# Patient Record
Sex: Male | Born: 2000 | Race: White | Hispanic: No | Marital: Single | Smoking: Never smoker
Health system: Southern US, Community
[De-identification: ages and names within clinical notes are randomized; demographics above are authoritative.]

## PROBLEM LIST (undated history)

## (undated) ENCOUNTER — Ambulatory Visit: Admission: EM | Payer: Self-pay

## (undated) DIAGNOSIS — G809 Cerebral palsy, unspecified: Secondary | ICD-10-CM

## (undated) HISTORY — PX: ABDOMINAL SURGERY: SHX537

## (undated) HISTORY — PX: HERNIA REPAIR: SHX51

---
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
## 2018-10-05 ENCOUNTER — Encounter (HOSPITAL_BASED_OUTPATIENT_CLINIC_OR_DEPARTMENT_OTHER): Payer: Self-pay

## 2018-10-05 ENCOUNTER — Emergency Department (HOSPITAL_BASED_OUTPATIENT_CLINIC_OR_DEPARTMENT_OTHER): Payer: No Typology Code available for payment source

## 2018-10-05 ENCOUNTER — Other Ambulatory Visit: Payer: Self-pay

## 2018-10-05 ENCOUNTER — Inpatient Hospital Stay (HOSPITAL_BASED_OUTPATIENT_CLINIC_OR_DEPARTMENT_OTHER)
Admission: EM | Admit: 2018-10-05 | Discharge: 2018-10-08 | DRG: 481 | Disposition: A | Discharge status: Home / Self Care | Payer: No Typology Code available for payment source | Attending: Student | Admitting: Student

## 2018-10-05 DIAGNOSIS — Z419 Encounter for procedure for purposes other than remedying health state, unspecified: Secondary | ICD-10-CM

## 2018-10-05 DIAGNOSIS — T148XXA Other injury of unspecified body region, initial encounter: Secondary | ICD-10-CM

## 2018-10-05 DIAGNOSIS — G801 Spastic diplegic cerebral palsy: Secondary | ICD-10-CM | POA: Diagnosis present

## 2018-10-05 DIAGNOSIS — W010XXA Fall on same level from slipping, tripping and stumbling without subsequent striking against object, initial encounter: Secondary | ICD-10-CM | POA: Diagnosis present

## 2018-10-05 DIAGNOSIS — Y990 Civilian activity done for income or pay: Secondary | ICD-10-CM

## 2018-10-05 DIAGNOSIS — S72142A Displaced intertrochanteric fracture of left femur, initial encounter for closed fracture: Secondary | ICD-10-CM | POA: Diagnosis present

## 2018-10-05 DIAGNOSIS — M25552 Pain in left hip: Secondary | ICD-10-CM | POA: Diagnosis present

## 2018-10-05 DIAGNOSIS — S72145A Nondisplaced intertrochanteric fracture of left femur, initial encounter for closed fracture: Principal | ICD-10-CM | POA: Diagnosis present

## 2018-10-05 DIAGNOSIS — Y9289 Other specified places as the place of occurrence of the external cause: Secondary | ICD-10-CM | POA: Diagnosis not present

## 2018-10-05 HISTORY — DX: Cerebral palsy, unspecified: G80.9

## 2018-10-05 LAB — CBC WITH DIFFERENTIAL/PLATELET
Abs Immature Granulocytes: 0.1 10*3/uL — ABNORMAL HIGH (ref 0.00–0.07)
BASOS ABS: 0.1 10*3/uL (ref 0.0–0.1)
Basophils Relative: 0 %
EOS ABS: 0.1 10*3/uL (ref 0.0–0.5)
EOS PCT: 0 %
HCT: 48.9 % (ref 39.0–52.0)
Hemoglobin: 16.6 g/dL (ref 13.0–17.0)
Immature Granulocytes: 1 %
Lymphocytes Relative: 9 %
Lymphs Abs: 1.7 10*3/uL (ref 0.7–4.0)
MCH: 30.2 pg (ref 26.0–34.0)
MCHC: 33.9 g/dL (ref 30.0–36.0)
MCV: 89.1 fL (ref 80.0–100.0)
Monocytes Absolute: 1.1 10*3/uL — ABNORMAL HIGH (ref 0.1–1.0)
Monocytes Relative: 6 %
NRBC: 0 % (ref 0.0–0.2)
Neutro Abs: 16.5 10*3/uL — ABNORMAL HIGH (ref 1.7–7.7)
Neutrophils Relative %: 84 %
Platelets: 219 10*3/uL (ref 150–400)
RBC: 5.49 MIL/uL (ref 4.22–5.81)
RDW: 11.7 % (ref 11.5–15.5)
WBC: 19.5 10*3/uL — AB (ref 4.0–10.5)

## 2018-10-05 LAB — BASIC METABOLIC PANEL
Anion gap: 7 (ref 5–15)
BUN: 16 mg/dL (ref 6–20)
CALCIUM: 9.5 mg/dL (ref 8.9–10.3)
CO2: 26 mmol/L (ref 22–32)
CREATININE: 0.86 mg/dL (ref 0.61–1.24)
Chloride: 105 mmol/L (ref 98–111)
Glucose, Bld: 118 mg/dL — ABNORMAL HIGH (ref 70–99)
Potassium: 3.2 mmol/L — ABNORMAL LOW (ref 3.5–5.1)
SODIUM: 138 mmol/L (ref 135–145)

## 2018-10-05 MED ORDER — MORPHINE SULFATE (PF) 4 MG/ML IV SOLN
4.0000 mg | Freq: Once | INTRAVENOUS | Status: DC
  Filled 2018-10-05: qty 1

## 2018-10-05 MED ORDER — ONDANSETRON HCL 4 MG/2ML IJ SOLN: 4.0000 mg | Freq: Three times a day (TID) | INTRAMUSCULAR | Status: DC | PRN

## 2018-10-05 MED ORDER — SODIUM CHLORIDE 0.45 % IV SOLN
INTRAVENOUS | Status: AC
  Administered 2018-10-06: 02:00:00 via INTRAVENOUS

## 2018-10-05 MED ORDER — MORPHINE SULFATE (PF) 4 MG/ML IV SOLN
INTRAVENOUS | Status: AC
  Filled 2018-10-05: qty 1

## 2018-10-05 MED ORDER — MORPHINE SULFATE (PF) 4 MG/ML IV SOLN
4.0000 mg | Freq: Once | INTRAVENOUS | Status: AC
  Administered 2018-10-05: 4 mg via INTRAVENOUS

## 2018-10-05 MED ORDER — ONDANSETRON HCL 4 MG/2ML IJ SOLN
4.0000 mg | Freq: Once | INTRAMUSCULAR | Status: AC
  Administered 2018-10-05: 4 mg via INTRAVENOUS
  Filled 2018-10-05: qty 2

## 2018-10-05 MED ORDER — FENTANYL CITRATE (PF) 100 MCG/2ML IJ SOLN
50.0000 ug | Freq: Once | INTRAMUSCULAR | Status: AC
  Administered 2018-10-05: 50 ug via NASAL
  Filled 2018-10-05: qty 2

## 2018-10-05 MED ORDER — ONDANSETRON HCL 4 MG/2ML IJ SOLN
4.0000 mg | Freq: Once | INTRAMUSCULAR | Status: DC
  Filled 2018-10-05: qty 2

## 2018-10-05 MED ORDER — HYDROMORPHONE HCL 1 MG/ML IJ SOLN
0.5000 mg | INTRAMUSCULAR | Status: DC | PRN
  Administered 2018-10-05: 0.5 mg via INTRAVENOUS
  Filled 2018-10-05: qty 1

## 2018-10-05 NOTE — ED Notes (Signed)
[facility] notified (Taryn) - patient ready for transport 

## 2018-10-05 NOTE — ED Notes (Signed)
Patient transported to X-ray 

## 2018-10-05 NOTE — ED Notes (Signed)
Pt screaming in pain- declined morphine at this time. Intranasal fentanyl ordered at this time- see MAR. Comfort measures provided. Pt to Xray shortly.

## 2018-10-05 NOTE — ED Triage Notes (Signed)
Pt arrives [facility] after a fall at work - fell on left hip. No LOC. Redness noted but no obvious deformity. 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 Mother at bedside. Abrasion to left elbow- denies pain. Pt a/o x 4

## 2018-10-05 NOTE — ED Provider Notes (Signed)
[facility] EMERGENCY DEPARTMENT Provider Note   CSN: [id number] Arrival date & time: 10/05/18  2015    History   Chief Complaint Chief Complaint  Patient presents with  . Fall    HPI Jason Watts is a 18 y.o. male.     The history is provided by the patient and a parent. No language interpreter was used.  Fall    Jason Watts is a 18 y.o. male who presents to the Emergency Department complaining of hip pain. He presents to the emergency department by EMS accompanied by his mother for evaluation of injuries following a fall. He was at work and slipped on a wet floor and fell in a cartoonlike fashion per patient to the floor, landing on his left hip. He reports severe pain to his left hip and thigh that radiates at times to his knee. His position of comfort is with the lower extremity flexed. He does have a history of cerebral palsy with spasticity in his left lower extremity. He is not on any current medications. No history of fracture. He denies any head injury, abdominal pain, chest pain. Past Medical History:  Diagnosis Date  . Cerebral palsy (HCC)    left side    Patient Active Problem List   Diagnosis Date Noted  . Closed fracture of femur, intertrochanteric, left, initial encounter (HCC) 10/05/2018  . Intertrochanteric fracture, closed, left, initial encounter (HCC) 10/05/2018    Past Surgical History:  Procedure Laterality Date  . ABDOMINAL SURGERY    . HERNIA REPAIR          Home Medications    Prior to Admission medications   Not on File    Family History No family history on file.  Social History Social History   Tobacco Use  . Smoking status: Never Smoker  . Smokeless tobacco: Never Used  Substance Use Topics  . Alcohol use: No  . Drug use: Never     Allergies   Patient has no known allergies.   Review of Systems Review of Systems  All other systems reviewed and are negative.    Physical Exam Updated Vital  Signs BP 120/70 (BP Location: Right Arm)   Pulse 88   Temp 98.3 F (36.8 C) (Oral)   Resp 16   Ht 6' (1.829 m)   Wt 61.2 kg   SpO2 100%   BMI 18.31 kg/m   Physical Exam Vitals signs and nursing note reviewed.  Constitutional:      Appearance: He is well-developed.  HENT:     Head: Normocephalic and atraumatic.  Cardiovascular:     Rate and Rhythm: Normal rate and regular rhythm.     Heart sounds: No murmur.  Pulmonary:     Effort: Pulmonary effort is normal. No respiratory distress.     Breath sounds: Normal breath sounds.  Abdominal:     Palpations: Abdomen is soft.     Tenderness: There is no abdominal tenderness. There is no guarding or rebound.  Musculoskeletal:        General: No tenderness.     Comments: 2+ DP pulses. There is tenderness to palpation about the left lateral hip and proximal thigh. Position of comfort is with the knee flexed and the hip flex. Unable to fully extend the left lower extremity. No significant tenderness to the knee or ankle. No significant edema to the left lower extremity.  Skin:    General: Skin is warm and dry.  Neurological:  Mental Status: He is alert and oriented to person, place, and time.  Psychiatric:        Behavior: Behavior normal.      ED Treatments / Results  Labs (all labs ordered are listed, but only abnormal results are displayed) Labs Reviewed  BASIC METABOLIC PANEL - Abnormal; Notable for the following components:      Result Value   Potassium 3.2 (*)    Glucose, Bld 118 (*)    All other components within normal limits  CBC WITH DIFFERENTIAL/PLATELET - Abnormal; Notable for the following components:   WBC 19.5 (*)    Neutro Abs 16.5 (*)    Monocytes Absolute 1.1 (*)    Abs Immature Granulocytes 0.10 (*)    All other components within normal limits    EKG None  Radiology Dg Hip Unilat W Or Wo Pelvis 2-3 Views Left  Result Date: 10/05/2018 CLINICAL DATA:  Pain after fall. EXAM: DG HIP (WITH OR WITHOUT  PELVIS) 2-3V LEFT COMPARISON:  None. FINDINGS: There appears to be a nondisplaced fracture through the left intertrochanteric region on all three views. No other abnormalities. IMPRESSION: Nondisplaced fracture through the intertrochanteric region on the left. Electronically Signed   By: Gerome Sam III M.D   On: 10/05/2018 21:37   Dg Femur Min 2 Views Left  Result Date: 10/05/2018 CLINICAL DATA:  Pain after fall EXAM: LEFT FEMUR 2 VIEWS COMPARISON:  None. FINDINGS: The distal 2/3 of the femur with image. The proximal femur was image on the hip films. No fractures are seen in the distal 2/3 of the femur or in the proximal tibia. Proximal fibula and patella are also normal. IMPRESSION: No fractures in the distal 2/3 of the left femur. Electronically Signed   By: Gerome Sam III M.D   On: 10/05/2018 21:38    Procedures Procedures (including critical care time)  Medications Ordered in ED Medications  0.45 % sodium chloride infusion (has no administration in time range)  HYDROmorphone (DILAUDID) injection 0.5 mg (0.5 mg Intravenous Given 10/05/18 2310)  ondansetron (ZOFRAN) injection 4 mg (has no administration in time range)  fentaNYL (SUBLIMAZE) injection 50 mcg (50 mcg Nasal Given 10/05/18 2053)  morphine 4 MG/ML injection 4 mg (4 mg Intravenous Given 10/05/18 2216)  ondansetron (ZOFRAN) injection 4 mg (4 mg Intravenous Given 10/05/18 2217)     Initial Impression / Assessment and Plan / ED Course  I have reviewed the triage vital signs and the nursing notes.  Pertinent labs & imaging results that were available during my care of the patient were reviewed by me and considered in my medical decision making (see chart for details).        Patient presents to the emergency department for evaluation of injuries following a mechanical fall. He does have significant pain on examination but is vascular intact. Imaging is significant for a nondisplaced left intertrochanteric fracture of the left  femur. Discussed with Dr. Magnus Ivan with orthopedic surgery, who agrees to see the patient at [facility] for further evaluation and management. Patient and family updated of findings of studies and recommendation for admission and they are in agreement with treatment plan.  Final Clinical Impressions(s) / ED Diagnoses   Final diagnoses:  None    ED Discharge Orders    None       Tilden Fossa, MD 10/05/18 2352

## 2018-10-05 NOTE — ED Notes (Signed)
Pt offered pain medication again - pt again refused.

## 2018-10-05 NOTE — ED Notes (Signed)
Contacted Dr. Magnus Ivan to return call to Dr. Madilyn Hook

## 2018-10-05 NOTE — ED Notes (Signed)
ED Provider at bedside. 

## 2018-10-05 NOTE — ED Notes (Signed)
Pts family came to nursing desk requesting water for patient. RN explained he was not allowed to have water at the current time. Mouth swabs given to patients mother.

## 2018-10-06 ENCOUNTER — Inpatient Hospital Stay (HOSPITAL_COMMUNITY): Payer: No Typology Code available for payment source

## 2018-10-06 ENCOUNTER — Encounter (HOSPITAL_COMMUNITY)
Admission: EM | Disposition: A | Discharge status: Home / Self Care | Payer: Self-pay | Source: Home / Self Care | Attending: Orthopaedic Surgery

## 2018-10-06 ENCOUNTER — Inpatient Hospital Stay (HOSPITAL_COMMUNITY): Payer: No Typology Code available for payment source | Admitting: Anesthesiology

## 2018-10-06 ENCOUNTER — Encounter (HOSPITAL_COMMUNITY): Payer: Self-pay

## 2018-10-06 HISTORY — PX: INTRAMEDULLARY (IM) NAIL INTERTROCHANTERIC: SHX5875

## 2018-10-06 LAB — MRSA PCR SCREENING: MRSA by PCR: NEGATIVE

## 2018-10-06 LAB — HIV ANTIBODY (ROUTINE TESTING W REFLEX): HIV Screen 4th Generation wRfx: NONREACTIVE

## 2018-10-06 SURGERY — FIXATION, FRACTURE, INTERTROCHANTERIC, WITH INTRAMEDULLARY ROD
Anesthesia: General | Site: Hip | Laterality: Left

## 2018-10-06 MED ORDER — ACETAMINOPHEN 325 MG PO TABS: 650.0000 mg | ORAL_TABLET | Freq: Four times a day (QID) | ORAL | Status: DC | PRN

## 2018-10-06 MED ORDER — ACETAMINOPHEN 650 MG RE SUPP: 650.0000 mg | Freq: Four times a day (QID) | RECTAL | Status: DC | PRN

## 2018-10-06 MED ORDER — VANCOMYCIN HCL 1000 MG IV SOLR
INTRAVENOUS | Status: DC | PRN
  Administered 2018-10-06: 1000 mg

## 2018-10-06 MED ORDER — METOPROLOL TARTRATE 5 MG/5ML IV SOLN
INTRAVENOUS | Status: AC
  Filled 2018-10-06: qty 5

## 2018-10-06 MED ORDER — LIDOCAINE 2% (20 MG/ML) 5 ML SYRINGE
INTRAMUSCULAR | Status: AC
  Filled 2018-10-06: qty 15

## 2018-10-06 MED ORDER — EPHEDRINE 5 MG/ML INJ
INTRAVENOUS | Status: AC
  Filled 2018-10-06: qty 10

## 2018-10-06 MED ORDER — FENTANYL CITRATE (PF) 250 MCG/5ML IJ SOLN
INTRAMUSCULAR | Status: AC
  Filled 2018-10-06: qty 5

## 2018-10-06 MED ORDER — PROPOFOL 10 MG/ML IV BOLUS
INTRAVENOUS | Status: DC | PRN
  Administered 2018-10-06: 160 mg via INTRAVENOUS

## 2018-10-06 MED ORDER — ROCURONIUM BROMIDE 50 MG/5ML IV SOSY
PREFILLED_SYRINGE | INTRAVENOUS | Status: AC
  Filled 2018-10-06: qty 25

## 2018-10-06 MED ORDER — PHENYLEPHRINE 40 MCG/ML (10ML) SYRINGE FOR IV PUSH (FOR BLOOD PRESSURE SUPPORT)
PREFILLED_SYRINGE | INTRAVENOUS | Status: AC
  Filled 2018-10-06: qty 10

## 2018-10-06 MED ORDER — LIDOCAINE 2% (20 MG/ML) 5 ML SYRINGE
INTRAMUSCULAR | Status: DC | PRN
  Administered 2018-10-06: 100 mg via INTRAVENOUS

## 2018-10-06 MED ORDER — DEXAMETHASONE SODIUM PHOSPHATE 10 MG/ML IJ SOLN
INTRAMUSCULAR | Status: AC
  Filled 2018-10-06: qty 3

## 2018-10-06 MED ORDER — ONDANSETRON HCL 4 MG/2ML IJ SOLN
INTRAMUSCULAR | Status: AC
  Filled 2018-10-06: qty 4

## 2018-10-06 MED ORDER — ONDANSETRON HCL 4 MG/2ML IJ SOLN: 4.0000 mg | Freq: Once | INTRAMUSCULAR | Status: DC | PRN

## 2018-10-06 MED ORDER — MORPHINE SULFATE (PF) 2 MG/ML IV SOLN: 2.0000 mg | INTRAVENOUS | Status: DC | PRN

## 2018-10-06 MED ORDER — ROCURONIUM BROMIDE 50 MG/5ML IV SOSY
PREFILLED_SYRINGE | INTRAVENOUS | Status: DC | PRN
  Administered 2018-10-06: 40 mg via INTRAVENOUS

## 2018-10-06 MED ORDER — DEXAMETHASONE SODIUM PHOSPHATE 10 MG/ML IJ SOLN
INTRAMUSCULAR | Status: DC | PRN
  Administered 2018-10-06: 5 mg via INTRAVENOUS

## 2018-10-06 MED ORDER — OXYCODONE HCL 5 MG PO TABS
5.0000 mg | ORAL_TABLET | ORAL | Status: DC | PRN
  Administered 2018-10-06 (×2): 5 mg via ORAL
  Administered 2018-10-07: 10 mg via ORAL
  Filled 2018-10-06: qty 2
  Filled 2018-10-06 (×2): qty 1

## 2018-10-06 MED ORDER — ONDANSETRON HCL 4 MG/2ML IJ SOLN
INTRAMUSCULAR | Status: AC
  Filled 2018-10-06: qty 2

## 2018-10-06 MED ORDER — ONDANSETRON HCL 4 MG/2ML IJ SOLN
INTRAMUSCULAR | Status: DC | PRN
  Administered 2018-10-06: 4 mg via INTRAVENOUS

## 2018-10-06 MED ORDER — FENTANYL CITRATE (PF) 100 MCG/2ML IJ SOLN: 25.0000 ug | INTRAMUSCULAR | Status: DC | PRN

## 2018-10-06 MED ORDER — MIDAZOLAM HCL 2 MG/2ML IJ SOLN
INTRAMUSCULAR | Status: AC
  Filled 2018-10-06: qty 2

## 2018-10-06 MED ORDER — PHENYLEPHRINE 40 MCG/ML (10ML) SYRINGE FOR IV PUSH (FOR BLOOD PRESSURE SUPPORT)
PREFILLED_SYRINGE | INTRAVENOUS | Status: AC
  Filled 2018-10-06: qty 20

## 2018-10-06 MED ORDER — HYDROMORPHONE HCL 1 MG/ML IJ SOLN
1.0000 mg | INTRAMUSCULAR | Status: DC | PRN
  Administered 2018-10-06 (×4): 1 mg via INTRAVENOUS
  Filled 2018-10-06 (×4): qty 1

## 2018-10-06 MED ORDER — CEFAZOLIN SODIUM-DEXTROSE 2-3 GM-%(50ML) IV SOLR
INTRAVENOUS | Status: DC | PRN
  Administered 2018-10-06: 2 g via INTRAVENOUS

## 2018-10-06 MED ORDER — ACETAMINOPHEN 325 MG PO TABS
650.0000 mg | ORAL_TABLET | Freq: Four times a day (QID) | ORAL | Status: DC
  Administered 2018-10-06 – 2018-10-08 (×7): 650 mg via ORAL
  Filled 2018-10-06 (×8): qty 2

## 2018-10-06 MED ORDER — SODIUM CHLORIDE 0.9 % IV SOLN
INTRAVENOUS | Status: DC
  Administered 2018-10-07: 09:00:00 via INTRAVENOUS

## 2018-10-06 MED ORDER — METHOCARBAMOL 1000 MG/10ML IJ SOLN
500.0000 mg | Freq: Four times a day (QID) | INTRAVENOUS | Status: DC | PRN
  Filled 2018-10-06 (×2): qty 5

## 2018-10-06 MED ORDER — HYDROMORPHONE HCL 1 MG/ML IJ SOLN
0.5000 mg | Freq: Once | INTRAMUSCULAR | Status: AC
  Administered 2018-10-06: 0.5 mg via INTRAVENOUS

## 2018-10-06 MED ORDER — FENTANYL CITRATE (PF) 250 MCG/5ML IJ SOLN
INTRAMUSCULAR | Status: DC | PRN
  Administered 2018-10-06 (×2): 50 ug via INTRAVENOUS

## 2018-10-06 MED ORDER — DIPHENHYDRAMINE HCL 12.5 MG/5ML PO ELIX: 12.5000 mg | ORAL_SOLUTION | ORAL | Status: DC | PRN

## 2018-10-06 MED ORDER — SODIUM CHLORIDE 0.9 % IV SOLN
INTRAVENOUS | Status: DC | PRN
  Administered 2018-10-06: 20 ug/min via INTRAVENOUS

## 2018-10-06 MED ORDER — SUGAMMADEX SODIUM 200 MG/2ML IV SOLN
INTRAVENOUS | Status: DC | PRN
  Administered 2018-10-06: 120 mg via INTRAVENOUS

## 2018-10-06 MED ORDER — ESMOLOL HCL 100 MG/10ML IV SOLN
INTRAVENOUS | Status: AC
  Filled 2018-10-06: qty 10

## 2018-10-06 MED ORDER — LACTATED RINGERS IV SOLN
INTRAVENOUS | Status: DC
  Administered 2018-10-06: 13:00:00 via INTRAVENOUS

## 2018-10-06 MED ORDER — CEFAZOLIN SODIUM-DEXTROSE 2-4 GM/100ML-% IV SOLN
2.0000 g | Freq: Three times a day (TID) | INTRAVENOUS | Status: AC
  Administered 2018-10-06 – 2018-10-07 (×3): 2 g via INTRAVENOUS
  Filled 2018-10-06 (×3): qty 100

## 2018-10-06 MED ORDER — OXYCODONE HCL 5 MG PO TABS
5.0000 mg | ORAL_TABLET | ORAL | Status: DC | PRN
  Administered 2018-10-06 (×2): 10 mg via ORAL
  Filled 2018-10-06 (×2): qty 2

## 2018-10-06 MED ORDER — OXYCODONE HCL 5 MG PO TABS: 5.0000 mg | ORAL_TABLET | Freq: Once | ORAL | Status: DC | PRN

## 2018-10-06 MED ORDER — METHOCARBAMOL 500 MG PO TABS
500.0000 mg | ORAL_TABLET | Freq: Four times a day (QID) | ORAL | Status: DC | PRN
  Administered 2018-10-06 (×3): 500 mg via ORAL
  Filled 2018-10-06 (×3): qty 1

## 2018-10-06 MED ORDER — 0.9 % SODIUM CHLORIDE (POUR BTL) OPTIME
TOPICAL | Status: DC | PRN
  Administered 2018-10-06: 1000 mL

## 2018-10-06 MED ORDER — CEFAZOLIN SODIUM 1 G IJ SOLR
INTRAMUSCULAR | Status: AC
  Filled 2018-10-06: qty 20

## 2018-10-06 MED ORDER — ASPIRIN 325 MG PO TABS
325.0000 mg | ORAL_TABLET | Freq: Every day | ORAL | Status: DC
  Administered 2018-10-07 – 2018-10-08 (×2): 325 mg via ORAL
  Filled 2018-10-06 (×2): qty 1

## 2018-10-06 MED ORDER — OXYCODONE HCL 5 MG/5ML PO SOLN: 5.0000 mg | Freq: Once | ORAL | Status: DC | PRN

## 2018-10-06 MED ORDER — PHENYLEPHRINE 40 MCG/ML (10ML) SYRINGE FOR IV PUSH (FOR BLOOD PRESSURE SUPPORT)
PREFILLED_SYRINGE | INTRAVENOUS | Status: DC | PRN
  Administered 2018-10-06 (×2): 80 ug via INTRAVENOUS
  Administered 2018-10-06: 120 ug via INTRAVENOUS

## 2018-10-06 MED ORDER — MIDAZOLAM HCL 5 MG/5ML IJ SOLN
INTRAMUSCULAR | Status: DC | PRN
  Administered 2018-10-06: 2 mg via INTRAVENOUS

## 2018-10-06 MED ORDER — PROPOFOL 10 MG/ML IV BOLUS
INTRAVENOUS | Status: AC
  Filled 2018-10-06: qty 20

## 2018-10-06 SURGICAL SUPPLY — 46 items
ADH SKN CLS APL DERMABOND .7 (GAUZE/BANDAGES/DRESSINGS) ×1
ADH SKN CLS LQ APL DERMABOND (GAUZE/BANDAGES/DRESSINGS) ×1
BIT DRILL FLUTED FEMUR 4.2/3 (BIT) ×1 IMPLANT
BLADE TFNA HELICAL 100 STRL (Orthopedic Implant) ×1 IMPLANT
BRUSH SCRUB SURG 4.25 DISP (MISCELLANEOUS) ×3 IMPLANT
CHLORAPREP W/TINT 26ML (MISCELLANEOUS) ×3 IMPLANT
COVER SURGICAL LIGHT HANDLE (MISCELLANEOUS) ×2 IMPLANT
COVER WAND RF STERILE (DRAPES) ×2 IMPLANT
DERMABOND ADHESIVE PROPEN (GAUZE/BANDAGES/DRESSINGS) ×1
DERMABOND ADVANCED (GAUZE/BANDAGES/DRESSINGS) ×1
DERMABOND ADVANCED .7 DNX12 (GAUZE/BANDAGES/DRESSINGS) ×1 IMPLANT
DERMABOND ADVANCED .7 DNX6 (GAUZE/BANDAGES/DRESSINGS) IMPLANT
DRAPE HALF SHEET 40X57 (DRAPES) ×4 IMPLANT
DRAPE IMP U-DRAPE 54X76 (DRAPES) ×6 IMPLANT
DRAPE INCISE IOBAN 66X45 STRL (DRAPES) ×2 IMPLANT
DRAPE STERI IOBAN 125X83 (DRAPES) ×2 IMPLANT
DRAPE SURG 17X23 STRL (DRAPES) ×3 IMPLANT
DRAPE U-SHAPE 47X51 STRL (DRAPES) ×2 IMPLANT
DRSG MEPILEX BORDER 4X4 (GAUZE/BANDAGES/DRESSINGS) ×4 IMPLANT
DRSG MEPILEX BORDER 4X8 (GAUZE/BANDAGES/DRESSINGS) ×2 IMPLANT
ELECT REM PT RETURN 9FT ADLT (ELECTROSURGICAL) ×2
ELECTRODE REM PT RTRN 9FT ADLT (ELECTROSURGICAL) ×1 IMPLANT
GLOVE BIO SURGEON STRL SZ 6.5 (GLOVE) ×6 IMPLANT
GLOVE BIO SURGEON STRL SZ7.5 (GLOVE) ×8 IMPLANT
GLOVE BIOGEL PI IND STRL 6.5 (GLOVE) ×1 IMPLANT
GLOVE BIOGEL PI IND STRL 7.5 (GLOVE) ×1 IMPLANT
GLOVE BIOGEL PI INDICATOR 6.5 (GLOVE) ×1
GLOVE BIOGEL PI INDICATOR 7.5 (GLOVE) ×1
GOWN STRL REUS W/ TWL LRG LVL3 (GOWN DISPOSABLE) ×1 IMPLANT
GOWN STRL REUS W/TWL LRG LVL3 (GOWN DISPOSABLE) ×2
GUIDEWIRE 3.2X400 (WIRE) ×1 IMPLANT
KIT BASIN OR (CUSTOM PROCEDURE TRAY) ×2 IMPLANT
KIT TURNOVER KIT B (KITS) ×2 IMPLANT
LINER BOOT UNIVERSAL DISP (MISCELLANEOUS) ×2 IMPLANT
MANIFOLD NEPTUNE II (INSTRUMENTS) ×2 IMPLANT
NAIL TROCH FIX 10X170 130 (Nail) ×1 IMPLANT
NS IRRIG 1000ML POUR BTL (IV SOLUTION) ×2 IMPLANT
PACK GENERAL/GYN (CUSTOM PROCEDURE TRAY) ×2 IMPLANT
PAD ARMBOARD 7.5X6 YLW CONV (MISCELLANEOUS) ×4 IMPLANT
SCREW LOCKING 5.0X34MM (Screw) ×1 IMPLANT
SUT MNCRL AB 3-0 PS2 18 (SUTURE) ×2 IMPLANT
SUT VIC AB 0 CT1 27 (SUTURE)
SUT VIC AB 0 CT1 27XBRD ANBCTR (SUTURE) IMPLANT
SUT VIC AB 2-0 CT1 27 (SUTURE) ×4
SUT VIC AB 2-0 CT1 TAPERPNT 27 (SUTURE) ×2 IMPLANT
TOWEL OR 17X26 10 PK STRL BLUE (TOWEL DISPOSABLE) ×3 IMPLANT

## 2018-10-06 NOTE — Transfer of Care (Signed)
Immediate Anesthesia Transfer of Care Note  Patient: Jason Watts  Procedure(s) Performed: INTRAMEDULLARY (IM) NAIL INTERTROCHANTRIC (Left Hip)  Patient Location: PACU  Anesthesia Type:General  Level of Consciousness: drowsy and patient cooperative  Airway & Oxygen Therapy: Patient Spontanous Breathing and Patient connected to nasal cannula oxygen  Post-op Assessment: Report given to RN and Post -op Vital signs reviewed and stable  Post vital signs: Reviewed and stable  Last Vitals:  Vitals Value Taken Time  BP 136/89 10/06/2018  2:40 PM  Temp 36.5 C 10/06/2018  2:40 PM  Pulse 87 10/06/2018  2:43 PM  Resp 10 10/06/2018  2:43 PM  SpO2 99 % 10/06/2018  2:43 PM  Vitals shown include unvalidated device data.  Last Pain:  Vitals:   10/06/18 1154  TempSrc:   PainSc: 9       Patients Stated Pain Goal: 3 (10/06/18 0343)  Complications: No apparent anesthesia complications

## 2018-10-06 NOTE — Op Note (Signed)
Orthopaedic Surgery Operative Note (CSN: [id number] ) Date of Surgery: 10/06/2018  Admit Date: 10/05/2018   Diagnoses: Pre-Op Diagnoses: Left intertrochanteric femur fracture  Post-Op Diagnosis: Same  Procedures: CPT 27245-Cephalomedullary nailing of left intertrochanteric femur fracture  Surgeons : Primary: Roby Lofts, MD  Assistant: Ulyses Southward, PA-C  Location:OR 3  Anesthesia:General   Antibiotics: Ancef 2g preop   Tourniquet time:None  Estimated Blood Loss:75 mL  Complications:None  Specimens:None   Implants: Implant Name Type Inv. Item Serial No. Manufacturer Lot No. LRB No. Used Action  NAIL TROCH FIX [id number] 130 - [id number] Nail NAIL TROCH FIX [id number] 130  [employer] [id number] Left 1 Implanted  BLADE TFNA HELICAL HIP - [id number] Orthopedic Implant BLADE TFNA HELICAL HIP  [employer] [id number] Left 1 Implanted  SCREW LOCKING 5.0X34MM - [id number] Screw SCREW LOCKING 5.0X34MM  [employer] [id number] Left 1 Implanted    Indications for Surgery: 18 year old male who fell while at work and sustained a nondisplaced left intertrochanteric femur fracture.  I recommend proceeding with cephalo-medullary nailing.  Risks and benefits were discussed with the patient.  Risks include but not limited to bleeding, infection, malunion, nonunion, hardware failure, screw cut out, nerve and blood vessel injury, DVT.  The patient wished to proceed with surgery and consent was obtained.  Operative Findings: Cephalo-medullary nailing of left intertrochanteric femur fracture using short TFN 70mm width with helical blade  Procedure: The patient was identified in the preoperative holding area. Consent was confirmed with the patient and their family and all questions were answered. The operative extremity was marked after confirmation with the patient and they were then brought back to the operating room by our anesthesia colleagues. The patient was placed under general  anesthesia and then carefully transferred over to a radiolucent flat top table. Fluoroscopic images were obtained and traction and manipulation was performed to reduce the fracture. Once adequate reduction was performed then the operative extremity was prepped and draped in sterile fashion. Preincision timeout was performed to verify the patient, the procedure and the extremity. Preoperative antibiotics were dosed.  A small incision was made proximal to the greater trochanter. A curved Mayo scissors was used to spread down to the greater trochanter in line with the abductor musculature. A threaded guidepin was positioned at an appropriate starting point on the AP and lateral views. It was advanced in the femur past the lesser trochanter. A entry reamer with soft tissue protector was then used to enter the canal. A radiographic ruler was used to judge the size of the canal of the femur and a 10 mm short nail was placed into the canal and seated down to an appropriate position radiographically. The targeting arm for the helical blade was attached. A percutaneous incision was made for the guide for the helical blade. A threaded guidepin was placed into the femoral neck and head and fluoroscopy was used to confirm adequate placement with an acceptable tip-apex distance. A drill was used to perforate the lateral cortex and 100 mm helical blade was inserted into the head/neck segment. The set screw was then tightened to set rotation and backed off to allow compression. The construct was compressed and obtained some further reduction of the fracture. The aiming arm was removed from the nail and position of the helical blade was confirmed with fluoroscopy. Using the targeting arm a distal interlock was placed bicortically in the shaft.  Final fluoroscopic images were obtained and the incisions were copiously irrigated. The skin was closed  with 2-0 vicryl, 3-0 monocryl and sealed with dermabond. The incisions were  dressing with Mepilex dressings. The patient was carefully transferred to the regular floor bed and was taken to PACU in stable condition.  Post Op Plan/Instructions: The patient will be weightbearing as tolerated to left lower extremity.  He will receive postoperative Ancef.  He will be on aspirin for DVT prophylaxis.  I was present and performed the entire surgery.  Ulyses Southward, PA-C did assist me throughout the case. An assistant was necessary given the difficulty in approach, maintenance of reduction and ability to instrument the fracture.   Truitt Merle, MD [facility]

## 2018-10-06 NOTE — ED Notes (Signed)
Attempted to call report x 3 - no answer.

## 2018-10-06 NOTE — Anesthesia Preprocedure Evaluation (Addendum)
Anesthesia Evaluation  Patient identified by MRN, date of birth, ID band Patient awake    Reviewed: Allergy & Precautions, NPO status , Patient's Chart, lab work & pertinent test results  History of Anesthesia Complications Negative for: history of anesthetic complications  Airway Mallampati: II  TM Distance: >3 FB Neck ROM: Full    Dental  (+) Dental Advisory Given   Pulmonary neg pulmonary ROS,    breath sounds clear to auscultation       Cardiovascular negative cardio ROS   Rhythm:Regular Rate:Normal     Neuro/Psych  Cerebral palsy  negative psych ROS   GI/Hepatic negative GI ROS, Neg liver ROS,   Endo/Other  negative endocrine ROS  Renal/GU negative Renal ROS     Musculoskeletal negative musculoskeletal ROS (+)   Abdominal   Peds  Hematology negative hematology ROS (+)   Anesthesia Other Findings   Reproductive/Obstetrics                            Anesthesia Physical Anesthesia Plan  ASA: II  Anesthesia Plan: General   Post-op Pain Management:    Induction: Intravenous  PONV Risk Score and Plan: 3 and Treatment may vary due to age or medical condition, Ondansetron, Dexamethasone and Midazolam  Airway Management Planned: Oral ETT  Additional Equipment: None  Intra-op Plan:   Post-operative Plan: Extubation in OR  Informed Consent: I have reviewed the patients History and Physical, chart, labs and discussed the procedure including the risks, benefits and alternatives for the proposed anesthesia with the patient or authorized representative who has indicated his/her understanding and acceptance.     Dental advisory given  Plan Discussed with: CRNA and Anesthesiologist  Anesthesia Plan Comments:       Anesthesia Quick Evaluation

## 2018-10-06 NOTE — Progress Notes (Signed)
Pt arrived to unit per stretcher. Pt alert and oriented x4. Complains of severe pain to left hip. Pt oriented to room and use of call light. Family at bedside.

## 2018-10-06 NOTE — Anesthesia Procedure Notes (Signed)
Procedure Name: Intubation Date/Time: 10/06/2018 1:19 PM Performed by: Elliot Dally, CRNA Pre-anesthesia Checklist: Patient identified, Emergency Drugs available, Suction available and Patient being monitored Patient Re-evaluated:Patient Re-evaluated prior to induction Oxygen Delivery Method: Circle System Utilized Preoxygenation: Pre-oxygenation with 100% oxygen Induction Type: IV induction Ventilation: Mask ventilation without difficulty Laryngoscope Size: Miller and 3 Grade View: Grade I Tube type: Oral Tube size: 7.5 mm Number of attempts: 1 Airway Equipment and Method: Stylet and Oral airway Placement Confirmation: ETT inserted through vocal cords under direct vision,  positive ETCO2 and breath sounds checked- equal and bilateral Secured at: 22 cm Tube secured with: Tape Dental Injury: Teeth and Oropharynx as per pre-operative assessment

## 2018-10-06 NOTE — ED Notes (Signed)
[facility] arrived to transport pt to [facility] 

## 2018-10-06 NOTE — H&P (Signed)
Jason Watts is an 18 y.o. male.   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  He reports that both feet went out from under him and he landed significantly hard on his left hip.  He was seen at [facility] and found to have a nondisplaced intertrochanteric fracture of the left hip.  He was transferred to [facility] for definitive treatment of this fracture.  He does report significant left hip pain.  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  He is been able to play hospital sports and walks with just a very mild limp.  He does not use any type of corrective or assistive device to ambulate and is very mobile.  He currently denies any headache, chest pain, shortness of breath, fever, chills, nausea, vomiting.  He denies any other issues or trauma as it relates to his left hip pain after this fall.  He denies any numbness tingling in his foot.  Past Medical History:  Diagnosis Date  . Cerebral palsy (HCC)    left side    Past Surgical History:  Procedure Laterality Date  . ABDOMINAL SURGERY    . HERNIA REPAIR      History reviewed. No pertinent family history. Social History:  reports that he has never smoked. He has never used smokeless tobacco. He reports that he does not drink alcohol or use drugs.  Allergies: No Known Allergies  No medications prior to admission.    Results for orders placed or performed during the hospital encounter of 10/05/18 (from the past 48 hour(s))  Basic metabolic panel     Status: Abnormal   Collection Time: 10/05/18  9:57 PM  Result Value Ref Range   Sodium 138 135 - 145 mmol/L   Potassium 3.2 (L) 3.5 - 5.1 mmol/L   Chloride 105 98 - 111 mmol/L   CO2 26 22 - 32 mmol/L   Glucose, Bld 118 (H) 70 - 99 mg/dL   BUN 16 6 - 20 mg/dL   Creatinine, Ser 1.61 0.61 - 1.24 mg/dL   Calcium  9.5 8.9 - 09.6 mg/dL   GFR calc non Af Amer >60 >60 mL/min   GFR calc Af Amer >60 >60 mL/min   Anion gap 7 5 - 15    Comment: Performed at [facility], [address] [place], [place] [address]  CBC with Differential     Status: Abnormal   Collection Time: 10/05/18  9:57 PM  Result Value Ref Range   WBC 19.5 (H) 4.0 - 10.5 K/uL   RBC 5.49 4.22 - 5.81 MIL/uL   Hemoglobin 16.6 13.0 - 17.0 g/dL   HCT 98.1 19.1 - 47.8 %   MCV 89.1 80.0 - 100.0 fL   MCH 30.2 26.0 - 34.0 pg   MCHC 33.9 30.0 - 36.0 g/dL   RDW 29.5 62.1 - 30.8 %   Platelets 219 150 - 400 K/uL   nRBC 0.0 0.0 - 0.2 %   Neutrophils Relative % 84 %   Neutro Abs 16.5 (H) 1.7 - 7.7 K/uL   Lymphocytes Relative 9 %   Lymphs Abs 1.7 0.7 - 4.0 K/uL   Monocytes Relative 6 %   Monocytes Absolute 1.1 (H) 0.1 - 1.0 K/uL   Eosinophils Relative 0 %   Eosinophils Absolute  0.1 0.0 - 0.5 K/uL   Basophils Relative 0 %   Basophils Absolute 0.1 0.0 - 0.1 K/uL   Immature Granulocytes 1 %   Abs Immature Granulocytes 0.10 (H) 0.00 - 0.07 K/uL    Comment: Performed at [facility], [address] [place], [place] [address]  MRSA PCR Screening     Status: None   Collection Time: 10/06/18  5:04 AM  Result Value Ref Range   MRSA by PCR NEGATIVE NEGATIVE    Comment:        The GeneXpert MRSA Assay (FDA approved for NASAL specimens only), is one component of a comprehensive MRSA colonization surveillance program. It is not intended to diagnose MRSA infection nor to guide or monitor treatment for MRSA infections. Performed at [facility], [address], [place], [place] [address]    Dg Hip Lucienne Capers Or Wo Pelvis 2-3 Views Left  Result Date: 10/05/2018 CLINICAL DATA:  Pain after fall. EXAM: DG HIP (WITH OR WITHOUT PELVIS) 2-3V LEFT COMPARISON:  None. FINDINGS: There appears to be a nondisplaced fracture through the left intertrochanteric region on all three views. No other abnormalities. IMPRESSION:  Nondisplaced fracture through the intertrochanteric region on the left. Electronically Signed   By: Gerome Sam III M.D   On: 10/05/2018 21:37   Dg Femur Min 2 Views Left  Result Date: 10/05/2018 CLINICAL DATA:  Pain after fall EXAM: LEFT FEMUR 2 VIEWS COMPARISON:  None. FINDINGS: The distal 2/3 of the femur with image. The proximal femur was image on the hip films. No fractures are seen in the distal 2/3 of the femur or in the proximal tibia. Proximal fibula and patella are also normal. IMPRESSION: No fractures in the distal 2/3 of the left femur. Electronically Signed   By: Gerome Sam III M.D   On: 10/05/2018 21:38    Review of Systems  All other systems reviewed and are negative.   Blood pressure (!) 103/54, pulse (!) 105, temperature 97.8 F (36.6 C), temperature source Oral, resp. rate 20, height 6' (1.829 m), weight 61.2 kg, SpO2 91 %. Physical Exam  Constitutional: He is oriented to person, place, and time. He appears well-developed and well-nourished.  HENT:  Head: Normocephalic and atraumatic.  Eyes: Pupils are equal, round, and reactive to light. EOM are normal.  Neck: Normal range of motion. Neck supple.  Cardiovascular: Normal rate and regular rhythm.  Respiratory: Effort normal and breath sounds normal.  GI: Soft. Bowel sounds are normal.  Musculoskeletal:     Left hip: He exhibits decreased range of motion, decreased strength, tenderness and bony tenderness.  Neurological: He is alert and oriented to person, place, and time.  Skin: Skin is warm and dry.  Psychiatric: He has a normal mood and affect.  His left foot is well-perfused with normal sensation.  He is able to move his foot and toes.  He is actually laying on his left side in bed because he says that actually feels better to him.  He does report some slight spasticity.  Assessment/Plan Left hip with nondisplaced intertrochanteric femur fracture  Our plan will be to have him set up for surgery today for  open reduction-internal fixation of his left hip.  He has been placed on the operating room schedule.  I will consult Dr. Jena Gauss with orthopedic trauma service for his expertise with this fracture or even 1 of my other partners.  Certainly I can have on the operating room schedule after  5 PM today for me to fix if there is no other OR availability.  I have communicated this to him and his mother.  They understand this fully.  He is n.p.o.  The risk and benefits of surgery were explained in detail and all question concerns were answered and addressed.  Kathryne Hitch, MD 10/06/2018, 7:59 AM

## 2018-10-06 NOTE — Care Management Note (Addendum)
Case Management Note  Patient Details  Name: Jason Watts MRN: [id number] Date of Birth: [birth year]  Subjective/Objective:                    Action/Plan:  Spoke to patient at bedside. He was injured while working at [employer] . 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 Called same spoke to supervisor Peyton Najjar who gave me Ashok Cordia [phone] number to call. Called spoke with Mrs Jeraldine Loots their Workers [employer] is [address] [place] [address]. Patient's adjustor will be Doristine Counter [phone] who is on vacation this week , so his supervisor Alex [phone] will be assisting.   Mrs Jeraldine Loots is in process now of filing claim, she will call NCM back this am with claim number. Awaiting call back.   Claim number [id number]. Called Adjustor Alex and left message. Awaiting call back.  Alex with [employer]  called back requesting clinicals be faxed to her at [phone] . Workers Comp Sports coach is being assigned.  Expected Discharge Date:                  Expected Discharge Plan:     In-House Referral:     Discharge planning Services  CM Consult  Post Acute Care Choice:    Choice offered to:  Patient  DME Arranged:    DME Agency:     HH Arranged:    HH Agency:     Status of Service:  In process, will continue to follow  If discussed at Long Length of Stay Meetings, dates discussed:    Additional Comments:  Kingsley Plan, RN 10/06/2018, 10:54 AM

## 2018-10-06 NOTE — Consult Note (Signed)
Orthopaedic Trauma Service (OTS) Consult   Patient ID: Jason Watts MRN: [id number] DOB/AGE: 2001/02/16 18 y.o.  Reason for Consult:Left intertrochanteric femur fracture Referring Physician: Dr. Doneen Poisson, MD Jason Watts  HPI: Jason Watts is an 18 y.o. male who is being seen in consultation at request of Dr. Magnus Watts for evaluation of left intertrochanteric femur fracture.  The patient was at work he works in [employer].  He slipped and fell landed on his left side had immediate pain and inability to bear weight.  X-rays show a nondisplaced left intertrochanteric femur fracture.  He was transferred to [facility].  I was asked to take over his care due to the complexity of his injury as well as the OR availability.  Currently the patient is in significant pain.  Denies any other injuries.  Denies any numbness or tingling.  The patient is healthy otherwise and works at [employer] lives at home with his parents.  He has a history of cerebral palsy but he is ambulatory without assist device.  Past Medical History:  Diagnosis Date  . Cerebral palsy (HCC)    left side    Past Surgical History:  Procedure Laterality Date  . ABDOMINAL SURGERY    . HERNIA REPAIR      History reviewed. No pertinent family history.  Social History:  reports that he has never smoked. He has never used smokeless tobacco. He reports that he does not drink alcohol or use drugs.  Allergies: No Known Allergies  Medications:  No current facility-administered medications on file prior to encounter.    No current outpatient medications on file prior to encounter.    ROS: Constitutional: No fever or chills Vision: No changes in vision ENT: No difficulty swallowing CV: No chest pain Pulm: No SOB or wheezing GI: No nausea or vomiting GU: No urgency or inability to hold urine Skin: No poor wound healing Neurologic: No numbness or tingling Psychiatric: No depression or anxiety Heme:  No bruising Allergic: No reaction to medications or food   Exam: Blood pressure (!) 103/54, pulse (!) 105, temperature 97.8 F (36.6 C), temperature source Oral, resp. rate 20, height 6' (1.829 m), weight 61.2 kg, SpO2 91 %. General: No acute distress Orientation: Awake alert and oriented x3 Mood and Affect: Cooperative and pleasant Gait: Unable to assess due to his fracture Coordination and balance: Within normal limits   Left lower extremity: Reveals no obvious deformity.  No skin lesions.  Significant pain with any attempted range of motion.  Motor and sensory function intact distally.  Warm well-perfused foot.  Unable to perform instability exam.  Right lower extremity: Skin without lesions. No tenderness to palpation. Full painless ROM, full strength in each muscle groups without evidence of instability.   Medical Decision Making: Imaging: AP pelvis with 2 views of the left hip show nondisplaced intertrochanteric femur fracture.  Labs:  Results for orders placed or performed during the hospital encounter of 10/05/18 (from the past 48 hour(s))  Basic metabolic panel     Status: Abnormal   Collection Time: 10/05/18  9:57 PM  Result Value Ref Range   Sodium 138 135 - 145 mmol/L   Potassium 3.2 (L) 3.5 - 5.1 mmol/L   Chloride 105 98 - 111 mmol/L   CO2 26 22 - 32 mmol/L   Glucose, Bld 118 (H) 70 - 99 mg/dL   BUN 16 6 - 20 mg/dL   Creatinine, Ser 0.93 0.61 - 1.24 mg/dL   Calcium  9.5 8.9 - 10.3 mg/dL   GFR calc non Af Amer >60 >60 mL/min   GFR calc Af Amer >60 >60 mL/min   Anion gap 7 5 - 15    Comment: Performed at [facility], [address] [place], [place] [address]  CBC with Differential     Status: Abnormal   Collection Time: 10/05/18  9:57 PM  Result Value Ref Range   WBC 19.5 (H) 4.0 - 10.5 K/uL   RBC 5.49 4.22 - 5.81 MIL/uL   Hemoglobin 16.6 13.0 - 17.0 g/dL   HCT 44.9 20.1 - 00.7 %   MCV 89.1 80.0 - 100.0 fL   MCH 30.2 26.0 - 34.0 pg   MCHC 33.9  30.0 - 36.0 g/dL   RDW 12.1 97.5 - 88.3 %   Platelets 219 150 - 400 K/uL   nRBC 0.0 0.0 - 0.2 %   Neutrophils Relative % 84 %   Neutro Abs 16.5 (H) 1.7 - 7.7 K/uL   Lymphocytes Relative 9 %   Lymphs Abs 1.7 0.7 - 4.0 K/uL   Monocytes Relative 6 %   Monocytes Absolute 1.1 (H) 0.1 - 1.0 K/uL   Eosinophils Relative 0 %   Eosinophils Absolute 0.1 0.0 - 0.5 K/uL   Basophils Relative 0 %   Basophils Absolute 0.1 0.0 - 0.1 K/uL   Immature Granulocytes 1 %   Abs Immature Granulocytes 0.10 (H) 0.00 - 0.07 K/uL    Comment: Performed at [facility], [address] [place], [place] [address]  MRSA PCR Screening     Status: None   Collection Time: 10/06/18  5:04 AM  Result Value Ref Range   MRSA by PCR NEGATIVE NEGATIVE    Comment:        The GeneXpert MRSA Assay (FDA approved for NASAL specimens only), is one component of a comprehensive MRSA colonization surveillance program. It is not intended to diagnose MRSA infection nor to guide or monitor treatment for MRSA infections. Performed at [facility], [address] [place], [place] [address]     Medical history and chart was reviewed  Assessment/Plan: 18 year old male with history of cerebral palsy with nondisplaced left intertrochanteric femur fracture  I recommend proceeding with intramedullary nailing of the left intertrochanteric femur fracture.  Risks and benefits were discussed with the patient and his mother.  Risks include but not limited to bleeding, infection, malunion, nonunion, hardware failure, nerve and blood vessel injury, DVT.  The patient and his mother agreed to proceed with surgery and consent was obtained.   Jason Lofts, MD [facility] [phone] (phone)

## 2018-10-07 ENCOUNTER — Encounter (HOSPITAL_COMMUNITY): Payer: Self-pay | Admitting: Student

## 2018-10-07 LAB — CBC
HCT: 38.9 % — ABNORMAL LOW (ref 39.0–52.0)
Hemoglobin: 13.5 g/dL (ref 13.0–17.0)
MCH: 30.5 pg (ref 26.0–34.0)
MCHC: 34.7 g/dL (ref 30.0–36.0)
MCV: 87.8 fL (ref 80.0–100.0)
Platelets: 155 10*3/uL (ref 150–400)
RBC: 4.43 MIL/uL (ref 4.22–5.81)
RDW: 11.5 % (ref 11.5–15.5)
WBC: 13.4 10*3/uL — ABNORMAL HIGH (ref 4.0–10.5)
nRBC: 0 % (ref 0.0–0.2)

## 2018-10-07 MED ORDER — GABAPENTIN 100 MG PO CAPS
100.0000 mg | ORAL_CAPSULE | Freq: Three times a day (TID) | ORAL | Status: DC
  Administered 2018-10-07 – 2018-10-08 (×4): 100 mg via ORAL
  Filled 2018-10-07 (×4): qty 1

## 2018-10-07 MED ORDER — KETOROLAC TROMETHAMINE 15 MG/ML IJ SOLN
15.0000 mg | Freq: Four times a day (QID) | INTRAMUSCULAR | Status: DC
  Administered 2018-10-07 – 2018-10-08 (×4): 15 mg via INTRAVENOUS
  Filled 2018-10-07 (×5): qty 1

## 2018-10-07 NOTE — Anesthesia Postprocedure Evaluation (Signed)
Anesthesia Post Note  Patient: Jason Watts  Procedure(s) Performed: INTRAMEDULLARY (IM) NAIL INTERTROCHANTRIC (Left Hip)     Patient location during evaluation: PACU Anesthesia Type: General Level of consciousness: awake and alert Pain management: pain level controlled Vital Signs Assessment: post-procedure vital signs reviewed and stable Respiratory status: spontaneous breathing, nonlabored ventilation, respiratory function stable and patient connected to nasal cannula oxygen Cardiovascular status: blood pressure returned to baseline and stable Postop Assessment: no apparent nausea or vomiting Anesthetic complications: no    Last Vitals:  Vitals:   10/07/18 0213 10/07/18 0449  BP: (!) 140/101 (!) 141/98  Pulse: 72 84  Resp: 18 18  Temp: 36.7 C 36.9 C  SpO2: 98% 99%    Last Pain:  Vitals:   10/07/18 0449  TempSrc: Oral  PainSc:                  Kennieth Rad

## 2018-10-07 NOTE — Care Management Note (Signed)
Case Management Note  Patient Details  Name: Jason Watts MRN: [id number] Date of Birth: 29-Dec-2000  Subjective/Objective:                    Action/Plan: Await PT evaluation and MD orders.    [employer] Workers Runner, broadcasting/film/video is Wendall Stade phone [phone], fax [phone]. Spoke with Ms Sampson Goon this am and provided update.   Once PT note is completed will call and fax Ms Sampson Goon. Patient also has Ms Sampson Goon contact information.  Expected Discharge Date:                  Expected Discharge Plan:     In-House Referral:     Discharge planning Services  CM Consult  Post Acute Care Choice:    Choice offered to:  Patient  DME Arranged:    DME Agency:     HH Arranged:    HH Agency:     Status of Service:  In process, will continue to follow  If discussed at Long Length of Stay Meetings, dates discussed:    Additional Comments:  Kingsley Plan, RN 10/07/2018, 10:44 AM

## 2018-10-07 NOTE — Care Management (Signed)
Faxed PT note and OP PT Centers information to [employer] ( Workers Comp Sports coach).   Ordered walker and 3 in 1 through [employer]. Will provide billing information.    Spoke with Wendall Stade regarding above.   Ronny Flurry RN [phone]

## 2018-10-07 NOTE — Discharge Instructions (Addendum)
Orthopaedic Trauma Service Discharge Instructions   General Discharge Instructions  WEIGHT BEARING STATUS: weightbearing as tolerated left lower extremity  RANGE OF MOTION/ACTIVITY: Full unrestricted range of motion  Wound Care: Incisions can be left open to air if no drainage. Okay to shower   DVT/PE prophylaxis: Aspirin daily  Diet: as you were eating previously.  Can use over the counter stool softeners and bowel preparations, such as Miralax, to help with bowel movements.  Narcotics can be constipating.  Be sure to drink plenty of fluids  PAIN MEDICATION USE AND EXPECTATIONS  You have likely been given narcotic medications to help control your pain.  After a traumatic event that results in an fracture (broken bone) with or without surgery, it is ok to use narcotic pain medications to help control one's pain.  We understand that everyone responds to pain differently and each individual patient will be evaluated on a regular basis for the continued need for narcotic medications. Ideally, narcotic medication use should last no more than 6-8 weeks (coinciding with fracture healing).   As a patient it is your responsibility as well to monitor narcotic medication use and report the amount and frequency you use these medications when you come to your office visit.   We would also advise that if you are using narcotic medications, you should take a dose prior to therapy to maximize you participation.  IF YOU ARE ON NARCOTIC MEDICATIONS IT IS NOT PERMISSIBLE TO OPERATE A MOTOR VEHICLE (MOTORCYCLE/CAR/TRUCK/MOPED) OR HEAVY MACHINERY DO NOT MIX NARCOTICS WITH OTHER CNS (CENTRAL NERVOUS SYSTEM) DEPRESSANTS SUCH AS ALCOHOL   STOP SMOKING OR USING NICOTINE PRODUCTS!!!!  As discussed nicotine severely impairs your body's ability to heal surgical and traumatic wounds but also impairs bone healing.  Wounds and bone heal by forming microscopic blood vessels (angiogenesis) and nicotine is a  vasoconstrictor (essentially, shrinks blood vessels).  Therefore, if vasoconstriction occurs to these microscopic blood vessels they essentially disappear and are unable to deliver necessary nutrients to the healing tissue.  This is one modifiable factor that you can do to dramatically increase your chances of healing your injury.    (This means no smoking, no nicotine gum, patches, etc)  DO NOT USE NONSTEROIDAL ANTI-INFLAMMATORY DRUGS (NSAID'S)  Using products such as Advil (ibuprofen), Aleve (naproxen), Motrin (ibuprofen) for additional pain control during fracture healing can delay and/or prevent the healing response.  If you would like to take over the counter (OTC) medication, Tylenol (acetaminophen) is ok.  However, some narcotic medications that are given for pain control contain acetaminophen as well. Therefore, you should not exceed more than 4000 mg of tylenol in a day if you do not have liver disease.  Also note that there are may OTC medicines, such as cold medicines and allergy medicines that my contain tylenol as well.  If you have any questions about medications and/or interactions please ask your doctor/PA or your pharmacist.      ICE AND ELEVATE INJURED/OPERATIVE EXTREMITY  Using ice and elevating the injured extremity above your heart can help with swelling and pain control.  Icing in a pulsatile fashion, such as 20 minutes on and 20 minutes off, can be followed.    Do not place ice directly on skin. Make sure there is a barrier between to skin and the ice pack.    Using frozen items such as frozen peas works well as the conform nicely to the are that needs to be iced.  USE AN ACE WRAP OR  TED HOSE FOR SWELLING CONTROL  In addition to icing and elevation, Ace wraps or TED hose are used to help limit and resolve swelling.  It is recommended to use Ace wraps or TED hose until you are informed to stop.    When using Ace Wraps start the wrapping distally (farthest away from the body) and  wrap proximally (closer to the body)   Example: If you had surgery on your leg or thing and you do not have a splint on, start the ace wrap at the toes and work your way up to the thigh        If you had surgery on your upper extremity and do not have a splint on, start the ace wrap at your fingers and work your way up to the upper arm   CALL THE OFFICE WITH ANY QUESTIONS OR CONCERNS: [phone]     Discharge Wound Care Instructions  Do NOT apply any ointments, solutions or lotions to pin sites or surgical wounds.  These prevent needed drainage and even though solutions like hydrogen peroxide kill bacteria, they also damage cells lining the pin sites that help fight infection.  Applying lotions or ointments can keep the wounds moist and can cause them to breakdown and open up as well. This can increase the risk for infection. When in doubt call the office.  Surgical incisions should be dressed daily.  If any drainage is noted, use one layer of adaptic, then gauze, Kerlix, and an ace wrap.  Once the incision is completely dry and without drainage, it may be left open to air out.  Showering may begin 36-48 hours later.  Cleaning gently with soap and water.  Traumatic wounds should be dressed daily as well.    One layer of adaptic, gauze, Kerlix, then ace wrap.  The adaptic can be discontinued once the draining has ceased    If you have a wet to dry dressing: wet the gauze with saline the squeeze as much saline out so the gauze is moist (not soaking wet), place moistened gauze over wound, then place a dry gauze over the moist one, followed by Kerlix wrap, then ace wrap.      [employer] Worker's Comp Sports coach is Wendall Stade phone [phone], fax [phone]

## 2018-10-07 NOTE — Progress Notes (Signed)
Orthopaedic Trauma Progress Note  S: Patient doing okay this morning, pain medications are helping some but not a ton. Not having too much pain in the hip, main concerns are pain in lower pole of patella/over patella tendon and pain on lateral aspect of the dorsum of his foot. States neither of these were present when he initially came into the emergency room, but they have started to bother him since surgery yesterday. He describes the pain in both locations as constant and states it feels better when pressure is applied to these areas. Ice seemed to help some as well overnight. Mother is at bedside, states his left leg is the one most affected by cerebral palsy and has the associated spasticity. Will continue to monitor these areas. Patient plans to work with therapy today if he is able to tolerate it.  O:  Vitals:   10/07/18 0213 10/07/18 0449  BP: (!) 140/101 (!) 141/98  Pulse: 72 84  Resp: 18 18  Temp: 98.1 F (36.7 C) 98.4 F (36.9 C)  SpO2: 98% 99%     General - Laying in bed, no acute distress.  Cardiac - Heart regular rate and rhythm Respiratory - CTA anterior lung fields bilaterally LLE - Dressings are clean, dry, intact. Minimal swelling or bruising noted. Minimal tenderness to palpation of the hip and thigh. Pain over lower pole of patella is better with palpation/pressure. No tenderness in the calf. No pain to palpation of foot except one area located on lateral aspect of dorsum of foot. Pain better with pressure to the area. Motor and sensory function intact. Neurovascularly intact  Imaging: stable post op imaging  Labs:  Results for orders placed or performed during the hospital encounter of 10/05/18 (from the past 24 hour(s))  CBC     Status: Abnormal   Collection Time: 10/07/18  4:07 AM  Result Value Ref Range   WBC 13.4 (H) 4.0 - 10.5 K/uL   RBC 4.43 4.22 - 5.81 MIL/uL   Hemoglobin 13.5 13.0 - 17.0 g/dL   HCT 46.8 (L) 03.2 - 12.2 %   MCV 87.8 80.0 - 100.0 fL   MCH  30.5 26.0 - 34.0 pg   MCHC 34.7 30.0 - 36.0 g/dL   RDW 48.2 50.0 - 37.0 %   Platelets 155 150 - 400 K/uL   nRBC 0.0 0.0 - 0.2 %    Assessment: 18 year old male s/p fall at work  Injuries: Left intertrochanteric femur fracture s/p cephalomedullary nailing on 10/06/18  Weightbearing: WBAT LLE  Insicional and dressing care: Dressings are clean, dry, intact. Will plan to change tomorrow  Orthopedic device(s):None  CV/Blood loss: Hgb 13.5 this morning. Hemodynamically stable  Pain management: 1. Tylenol 650 mg q 6 hours scheduled 2. Morphine 2 mg q 2 hours PRN 3. Oxycodone 5-10 mg q 4 hours PRN 4. Robaxin 500 mg q 6 hours PRN  VTE prophylaxis: Aspirin 325 mg daily  ID: Ancef 2 gm post op  Foley/Lines: No foley, KVO IVFs  Medical co-morbidities: cerebral palsy  Impediments to Fracture Healing: None  Dispo: PT/OT eval, dispo pending  Follow - up plan: 2 weeks   Tzipora Mcinroy A. Ladonna Snide [facility] ?([phone]? (phone)

## 2018-10-07 NOTE — Evaluation (Signed)
Occupational Therapy Evaluation Patient Details Name: Jason Watts MRN: [id number] DOB: 03/03/2001 Today's Date: 10/07/2018    History of Present Illness Pt is a 18 yo male who slipped and fell at work onto his L hip, resulting in an intertrochanteric femur fracture that required surgical repair (IM nail). PMH: cerebral palsy.   Clinical Impression   PTA, pt was living with his parents and attending [employer] and working at [employer]. Pt currently requiring Min guard A for LB ADLs and functional mobility with RW. Pt presenting with decreased balance and is limited by pain, but he is very motivated to return to PLOF. Pt would benefit from further acute OT to address safe shower transfer and facilitate safe dc. Recommend dc to home once medically stable per physician.      Follow Up Recommendations  No OT follow up;Supervision/Assistance - 24 hour    Equipment Recommendations  3 in 1 bedside commode    Recommendations for Other Services PT consult     Precautions / Restrictions Precautions Precautions: Fall Restrictions Weight Bearing Restrictions: Yes LLE Weight Bearing: Weight bearing as tolerated      Mobility Bed Mobility Overal bed mobility: Needs Assistance Bed Mobility: Supine to Sit     Supine to sit: Min guard;HOB elevated     General bed mobility comments: In recliner upon arrival  Transfers Overall transfer level: Needs assistance Equipment used: Rolling walker (2 wheeled) Transfers: Sit to/from Stand Sit to Stand: Min guard         General transfer comment: Min guard for safety, pt able to power up from bed to stand and control descent to chair from standing; verbal cues for safe hand placement    Balance Overall balance assessment: Needs assistance Sitting-balance support: Feet supported;No upper extremity supported Sitting balance-Leahy Scale: Normal     Standing balance support: Bilateral upper extremity supported;During functional  activity Standing balance-Leahy Scale: Fair Standing balance comment: Able to maintain static standing during LB dressing                           ADL either performed or assessed with clinical judgement   ADL Overall ADL's : Needs assistance/impaired Eating/Feeding: Independent   Grooming: Min guard;Standing   Upper Body Bathing: Set up;Sitting   Lower Body Bathing: Min guard;Sit to/from stand   Upper Body Dressing : Min guard;Standing Upper Body Dressing Details (indicate cue type and reason): Donned shirt while standing with Min guard A for safety Lower Body Dressing: Min guard;Sit to/from stand Lower Body Dressing Details (indicate cue type and reason): Educating pt on compensatory techniques for donning pants. Required Min guard A for safety Toilet Transfer: Min guard;Ambulation;RW(simulated to recliner)         Tub/Shower Transfer Details (indicate cue type and reason): Discussed with mom, [place] of 3N1 as shower seat. Will need education on safe shower transfer Functional mobility during ADLs: Min guard;Rolling walker General ADL Comments: Pt presenting with decreased balance. Hihgly motivated to return home and to school     Vision         Perception     Praxis      Pertinent Vitals/Pain Pain Assessment: 0-10 Pain Score: 6  Pain Location: L hip Pain Descriptors / Indicators: Operative site guarding;Grimacing;Sore Pain Intervention(s): Monitored during session;Limited activity within patient's tolerance;Repositioned     Hand Dominance     Extremity/Trunk Assessment Upper Extremity Assessment Upper Extremity Assessment: Overall WFL for tasks assessed  Lower Extremity Assessment Lower Extremity Assessment: Defer to PT evaluation LLE Deficits / Details: able to flex hip and perform abduction/adduction in supine with support under heel   Cervical / Trunk Assessment Cervical / Trunk Assessment: Normal   Communication Communication Communication:  No difficulties   Cognition Arousal/Alertness: Awake/alert Behavior During Therapy: WFL for tasks assessed/performed Overall Cognitive Status: Within Functional Limits for tasks assessed                                     General Comments  Mother present throughout session    Exercises     Shoulder Instructions      Home Living Family/patient expects to be discharged to:: Private residence Living Arrangements: Parent Available Help at Discharge: Available 24 hours/day Type of Home: House Home Access: Stairs to enter [employer] of Steps: 2 Entrance Stairs-Rails: None Home Layout: Two level;Able to live on main level with bedroom/bathroom Alternate Level Stairs-Number of Steps: flight   Bathroom Shower/Tub: Chief Strategy Officer: Standard Bathroom Accessibility: No   Home Equipment: None          Prior Functioning/Environment Level of Independence: Independent        Comments: Attends [employer] and majoring in math and education. Works at [employer].        OT Problem List: Decreased strength;Decreased range of motion;Decreased activity tolerance;Impaired balance (sitting and/or standing);Decreased knowledge of precautions;Decreased knowledge of use of DME or AE;Pain      OT Treatment/Interventions: Self-care/ADL training;Therapeutic exercise;Energy conservation;DME and/or AE instruction;Therapeutic activities;Patient/family education    OT Goals(Current goals can be found in the care plan section) Acute Rehab OT Goals Patient Stated Goal: go home OT Goal Formulation: With patient Time For Goal Achievement: 10/21/18 Potential to Achieve Goals: Good  OT Frequency: Min 2X/week   Barriers to D/C:            Co-evaluation              AM-PAC OT "6 Clicks" Daily Activity     Outcome Measure Help from another person eating meals?: None Help from another person taking care of personal grooming?: None Help from  another person toileting, which includes using toliet, bedpan, or urinal?: A Little Help from another person bathing (including washing, rinsing, drying)?: A Little Help from another person to put on and taking off regular upper body clothing?: None Help from another person to put on and taking off regular lower body clothing?: A Little 6 Click Score: 21   End of Session Equipment Utilized During Treatment: Rolling walker Nurse Communication: Mobility status  Activity Tolerance: Patient tolerated treatment well Patient left: in chair;with call bell/phone within reach;with family/visitor present  OT Visit Diagnosis: Unsteadiness on feet (R26.81);Other abnormalities of gait and mobility (R26.89);Muscle weakness (generalized) (M62.81);Pain Pain - Right/Left: Left Pain - part of body: Leg                Time: 9892-1194 OT Time Calculation (min): 20 min Charges:  OT General Charges $OT Visit: 1 Visit OT Evaluation $OT Eval Moderate Complexity: 1 Mod  Mancel Lardizabal MSOT, OTR/L Acute Rehab Pager: [phone] Office: [phone]  Theodoro Grist Konner Saiz 10/07/2018, 5:44 PM

## 2018-10-07 NOTE — Progress Notes (Addendum)
OT Cancellation Note  Patient Details Name: Jason Watts MRN: [id number] DOB: 05-07-2001   Cancelled Treatment:    Reason Eval/Treat Not Completed: Patient at procedure or test/ unavailable(With PT. Will return as schedule allows. Thank you.)  Stetson Pelaez M Tinsley Lomas Jamin Humphries MSOT, OTR/L Acute Rehab Pager: [phone] Office: [phone] 10/07/2018, 3:17 PM

## 2018-10-07 NOTE — Evaluation (Signed)
Physical Therapy Evaluation Patient Details Name: Jason Watts MRN: [id number] DOB: 10/29/2000 Today's Date: 10/07/2018   History of Present Illness  Pt is a 18 yo male who slipped and fell at work onto his L hip, resulting in an intertrochanteric femur fracture that required surgical repair (IM nail). PMH: cerebral palsy.   Clinical Impression   Pt received in bed, willing to participate in therapy. Overall mobility with min guard for safety (see details below). Pt has not yet practiced stairs, and he must go up two steps without a railing in order to get into his home. He will benefit from continued skilled acute PT services in order to safely progress his functional mobility and allow for safe DC home.  Recommending follow up at outpatient PT to ensure that pt is able to progress functional mobility and strength safely and eventually return to work.    Follow Up Recommendations Outpatient PT    Equipment Recommendations  Rolling walker with 5" wheels;3in1 (PT)    Recommendations for Other Services       Precautions / Restrictions Precautions Precautions: Fall Restrictions Weight Bearing Restrictions: Yes LLE Weight Bearing: Weight bearing as tolerated      Mobility  Bed Mobility Overal bed mobility: Needs Assistance Bed Mobility: Supine to Sit     Supine to sit: Min guard;HOB elevated     General bed mobility comments: HOB slightly elevated, use of bed rails, min guard for safety; pt able to perform bed mobility with minimally increased pain  Transfers Overall transfer level: Needs assistance Equipment used: Rolling walker (2 wheeled) Transfers: Sit to/from Stand Sit to Stand: Min guard         General transfer comment: Min guard for safety, pt able to power up from bed to stand and control descent to chair from standing; verbal cues for safe hand placement  Ambulation/Gait Ambulation/Gait assistance: Min guard Gait Distance (Feet): 175 Feet Assistive  device: Rolling walker (2 wheeled) Gait Pattern/deviations: Step-to pattern;Narrow base of support Gait velocity: decreased   General Gait Details: Verbal cues provided on WBAT status; pt very painful at this time with weightbearing so he tended to maintain touchdown weightbearing. Verbal cues for use of UE to offweight painful L LE. Pt stated that walking became easier with practice. Noted overall LE IR on bil LEs, likely related to his Cerebral Palsy.  Stairs            Wheelchair Mobility    Modified Rankin (Stroke Patients Only)       Balance Overall balance assessment: Needs assistance Sitting-balance support: Feet supported;No upper extremity supported Sitting balance-Leahy Scale: Normal     Standing balance support: Bilateral upper extremity supported;During functional activity Standing balance-Leahy Scale: Poor Standing balance comment: Pt requires bil UE support during standing and ambulation for balance, due to high pain levels                             Pertinent Vitals/Pain Pain Assessment: 0-10 Pain Score: 4  Pain Location: L hip Pain Descriptors / Indicators: Operative site guarding;Grimacing;Sore Pain Intervention(s): Limited activity within patient's tolerance;Monitored during session;Premedicated before session;Repositioned    Home Living Family/patient expects to be discharged to:: Private residence Living Arrangements: Parent Available Help at Discharge: Available 24 hours/day Type of Home: House Home Access: Stairs to enter Entrance Stairs-Rails: None Entrance Stairs-Number of Steps: 2 Home Layout: Two level;Able to live on main level with bedroom/bathroom Home Equipment: None  Prior Function Level of Independence: Independent         Comments: Pt does not use an AD at baseline. He has cerebral palsy, which he reports affects his movement mildly.     Hand Dominance        Extremity/Trunk Assessment   Upper Extremity  Assessment Upper Extremity Assessment: Overall WFL for tasks assessed    Lower Extremity Assessment Lower Extremity Assessment: LLE deficits/detail LLE Deficits / Details: able to flex hip and perform abduction/adduction in supine with support under heel    Cervical / Trunk Assessment Cervical / Trunk Assessment: Normal  Communication   Communication: No difficulties  Cognition Arousal/Alertness: Awake/alert Behavior During Therapy: WFL for tasks assessed/performed Overall Cognitive Status: Within Functional Limits for tasks assessed                                        General Comments General comments (skin integrity, edema, etc.): Education provided on importance of mobility in order to prevent contractures and muscle spasms, which pt is more predisposed to due to his cerebral palsy.    Exercises     Assessment/Plan    PT Assessment Patient needs continued PT services  PT Problem List Decreased balance;Decreased strength;Pain;Impaired tone;Decreased mobility;Decreased knowledge of use of DME;Decreased coordination       PT Treatment Interventions DME instruction;Functional mobility training;Balance training;Patient/family education;Gait training;Therapeutic activities;Neuromuscular re-education;Stair training;Therapeutic exercise    PT Goals (Current goals can be found in the Care Plan section)  Acute Rehab PT Goals Patient Stated Goal: go home PT Goal Formulation: With patient/family Time For Goal Achievement: 10/21/18 Potential to Achieve Goals: Good    Frequency Min 6X/week   Barriers to discharge        Co-evaluation               AM-PAC PT "6 Clicks" Mobility  Outcome Measure Help needed turning from your back to your side while in a flat bed without using bedrails?: None Help needed moving from lying on your back to sitting on the side of a flat bed without using bedrails?: None Help needed moving to and from a bed to a chair  (including a wheelchair)?: None Help needed standing up from a chair using your arms (e.g., wheelchair or bedside chair)?: None Help needed to walk in hospital room?: A Little Help needed climbing 3-5 steps with a railing? : A Little 6 Click Score: 22    End of Session Equipment Utilized During Treatment: Gait belt Activity Tolerance: Patient tolerated treatment well Patient left: in chair;with call bell/phone within reach;with family/visitor present   PT Visit Diagnosis: Other abnormalities of gait and mobility (R26.89);Pain Pain - Right/Left: Left Pain - part of body: Hip    Time: 6010-9323 PT Time Calculation (min) (ACUTE ONLY): 30 min   Charges:   PT Evaluation $PT Eval Low Complexity: 1 Low PT Treatments $Gait Training: 8-22 mins        Halina Andreas, SPT    Halina Andreas 10/07/2018, 4:17 PM

## 2018-10-08 MED ORDER — GABAPENTIN 100 MG PO CAPS: 100.0000 mg | ORAL_CAPSULE | Freq: Three times a day (TID) | ORAL | 0 refills | Status: DC

## 2018-10-08 MED ORDER — OXYCODONE HCL 5 MG PO TABS: 5.0000 mg | ORAL_TABLET | ORAL | 0 refills | Status: DC | PRN

## 2018-10-08 MED ORDER — ACETAMINOPHEN 500 MG PO TABS: 500.0000 mg | ORAL_TABLET | Freq: Two times a day (BID) | ORAL | 1 refills | Status: DC

## 2018-10-08 MED ORDER — METHOCARBAMOL 500 MG PO TABS: 500.0000 mg | ORAL_TABLET | Freq: Four times a day (QID) | ORAL | 0 refills | Status: DC | PRN

## 2018-10-08 MED ORDER — ASPIRIN 325 MG PO TABS: 325.0000 mg | ORAL_TABLET | Freq: Every day | ORAL | 0 refills | Status: DC

## 2018-10-08 NOTE — Progress Notes (Signed)
Patient discharged to home with instructions and equipments. 

## 2018-10-08 NOTE — Care Management (Signed)
Wendall Stade ( [employer]) aware discharge is today . Recommendations OP PT, 3 in 1 and walker. Ms Sampson Goon on her way to the hospital at present and will arrange. Patient aware.   Ronny Flurry RN [phone]

## 2018-10-08 NOTE — Discharge Summary (Signed)
Orthopaedic Trauma Service (OTS) Discharge Summary   Patient ID: Jason Watts MRN: [id number] DOB/AGE: 04-Sep-2000 18 y.o.  Admit date: 10/05/2018 Discharge date: 10/08/2018  Admission Diagnoses: Left intertrochanteric femur fracture  Discharge Diagnoses:  Principal Problem:   Closed fracture of femur, intertrochanteric, left, initial encounter [facility]) Active Problems:   Intertrochanteric fracture, closed, left, initial encounter [facility])   Past Medical History:  Diagnosis Date  . Cerebral palsy (HCC)    left side     Procedures Performed: Cephalomedullary nailing left intertrochanteric femur fracture  Discharged Condition: good  Hospital Course: Patient presented to [facility] on 10/05/2018 after a fall at work. He was found to have a nondisplaced left intertrochanteric femur fracture. Orthopaedics was consulted and patient was transferred to [facility] for surgical fixation. He was taken to the operating room by Dr. Caryn Bee Haddix on 10/06/2018 for cephalomedullary nailing of the left femur. Patient tolerated the procedure well. He was made weightbearing as tolerated on the left lower extremity following the procedure. He was placed on Aspirin 325 mg daily for DVT prophylaxis. He began working with therapies starting on POD #1.  On 10/08/2018, the patient was tolerating diet, working well with therapies, pain well controlled, vital signs stable, dressings clean, dry, intact and felt stable for discharge to home. Patient will follow up as below and knows to call with questions or concerns.     Consults: None  Significant Diagnostic Studies: None  Treatments: surgery: Cephalomedullary nailing left intertrochanteric femur fracture  Discharge Exam: General - Sitting up in bed, NAD Cardiac - Heart regular rate and rhythm Respiratory - CTA anterior lung fields bilaterally LLE - Dressings removed, incisions are clean, dry, intact. Mild tenderness with palpation  of hip and thigh. No pain to palpation of knee, lower, leg, or foot. Excellent knee and ankle ROM. Motor and sensory function intact, Neurovascularly intact.  Disposition: Home/self-care   Allergies as of 10/08/2018   No Known Allergies     Medication List    TAKE these medications   acetaminophen 500 MG tablet Commonly known as:  TYLENOL Take 1 tablet (500 mg total) by mouth every 12 (twelve) hours.   aspirin 325 MG tablet Take 1 tablet (325 mg total) by mouth daily.   gabapentin 100 MG capsule Commonly known as:  NEURONTIN Take 1 capsule (100 mg total) by mouth 3 (three) times daily for 7 days.   methocarbamol 500 MG tablet Commonly known as:  ROBAXIN Take 1 tablet (500 mg total) by mouth every 6 (six) hours as needed for muscle spasms.   oxyCODONE 5 MG immediate release tablet Commonly known as:  Oxy IR/ROXICODONE Take 1 tablet (5 mg total) by mouth every 4 (four) hours as needed for moderate pain or severe pain.            Durable Medical Equipment  (From admission, onward)         Start     Ordered   10/07/18 1551  For home use only DME Walker rolling  Once    Comments:  Ht 6'  WT 61.2 kg  Question:  Patient needs a walker to treat with the following condition  Answer:  Displaced intertrochanteric fracture of left femur, initial encounter for closed fracture (HCC)   10/07/18 1551   10/07/18 1550  For home use only DME 3 n 1  Once    Comments:  HT 6'  WT 61.2 kg   10/07/18 1551  Follow-up Information    Haddix, Gillie Manners, MD. Schedule an appointment as soon as possible for a visit in 2 week(s).   Specialty:  Orthopedic Surgery Why:  repeat x-rays Contact information: [address] [place] [address] [phone]           Discharge Instructions and Plan: Patient will be discharged to home. He will be weightbearing as tolerated on left leg. He has no restrictions on range of motion of the leg. He will be on Aspirin 325 mg for DVT  prophylaxis. He has been provided with the appropriate DME for discharge. He will plan to work with outpatient physical therapy. He will follow up with Dr. Jena Gauss in 2 weeks for repeat x-rays of his left femur. All questions were answered the patinet and his mother's satisfaction.    Signed:  Shawn Route. Ladonna Snide ?[phone]? (phone) 10/08/2018, 8:04 AM  [facility] [address] [place] [address] [phone] (F)

## 2018-10-08 NOTE — Progress Notes (Signed)
Physical Therapy Treatment Patient Details Name: Jason Watts MRN: [id number] DOB: 07/04/2001 Today's Date: 10/08/2018    History of Present Illness Pt is a 18 yo male who slipped and fell at work onto his L hip, resulting in an intertrochanteric femur fracture that required surgical repair (IM nail). PMH: cerebral palsy.    PT Comments    Patient seen for mobility progression. Pt continues to make progress toward PT goals and tolerated stair training well. Current plan remains appropriate.    Follow Up Recommendations  Outpatient PT     Equipment Recommendations  Rolling walker with 5" wheels;3in1 (PT)    Recommendations for Other Services       Precautions / Restrictions Precautions Precautions: Fall Restrictions Weight Bearing Restrictions: Yes LLE Weight Bearing: Weight bearing as tolerated    Mobility  Bed Mobility               General bed mobility comments: pt OOB in chair upon arrival  Transfers Overall transfer level: Needs assistance Equipment used: Rolling walker (2 wheeled) Transfers: Sit to/from Stand Sit to Stand: Supervision         General transfer comment: for safety  Ambulation/Gait Ambulation/Gait assistance: Supervision Gait Distance (Feet): 300 Feet Assistive device: Rolling walker (2 wheeled) Gait Pattern/deviations: Step-to pattern;Narrow base of support;Step-through pattern;Decreased stance time - left;Decreased step length - right;Decreased dorsiflexion - left;Decreased weight shift to left;Antalgic Gait velocity: decreased   General Gait Details: heavy reliance on bilat UE; max cues for L heel strike/foot flat/toe off as pt tends to maintain L LE flexion to avoid increased pain with weight bearing; cues for sequencing to improve step through pattern   Stairs Stairs: Yes Stairs assistance: Min assist Stair Management: No rails;Step to pattern;Backwards;With walker   General stair comments: practiced 2 steps X 2 trials  simulating home entrance adn pt able to educate mother on technique/sequencing when back to room; assist to stabilize RW    Wheelchair Mobility    Modified Rankin (Stroke Patients Only)       Balance Overall balance assessment: Needs assistance Sitting-balance support: Feet supported;No upper extremity supported Sitting balance-Leahy Scale: Normal     Standing balance support: Bilateral upper extremity supported;During functional activity Standing balance-Leahy Scale: Fair Standing balance comment: Able to maintain static standing during LB dressing                            Cognition Arousal/Alertness: Awake/alert Behavior During Therapy: WFL for tasks assessed/performed Overall Cognitive Status: Within Functional Limits for tasks assessed                                 General Comments: Verbalized frustration about functional status and beinging in the hospital      Exercises      General Comments General comments (skin integrity, edema, etc.): Mother present      Pertinent Vitals/Pain Pain Assessment: Faces Faces Pain Scale: Hurts little more Pain Location: LLE Pain Descriptors / Indicators: Sore;Guarding Pain Intervention(s): Limited activity within patient's tolerance;Monitored during session;Repositioned    Home Living                      Prior Function            PT Goals (current goals can now be found in the care plan section) Acute Rehab PT Goals Patient Stated Goal: go  home Progress towards PT goals: Progressing toward goals    Frequency    Min 6X/week      PT Plan Current plan remains appropriate    Co-evaluation              AM-PAC PT "6 Clicks" Mobility   Outcome Measure  Help needed turning from your back to your side while in a flat bed without using bedrails?: None Help needed moving from lying on your back to sitting on the side of a flat bed without using bedrails?: None Help needed  moving to and from a bed to a chair (including a wheelchair)?: None Help needed standing up from a chair using your arms (e.g., wheelchair or bedside chair)?: None Help needed to walk in hospital room?: None Help needed climbing 3-5 steps with a railing? : A Little 6 Click Score: 23    End of Session Equipment Utilized During Treatment: Gait belt Activity Tolerance: Patient tolerated treatment well Patient left: in chair;with call bell/phone within reach;with family/visitor present;with nursing/sitter in room Nurse Communication: Mobility status PT Visit Diagnosis: Other abnormalities of gait and mobility (R26.89);Pain Pain - Right/Left: Left Pain - part of body: Hip     Time: 8882-8003 PT Time Calculation (min) (ACUTE ONLY): 23 min  Charges:  $Gait Training: 23-37 mins                     Erline Levine, PTA Acute Rehabilitation Services Pager: [phone] Office: [phone]     Carolynne Edouard 10/08/2018, 10:41 AM

## 2018-10-08 NOTE — Progress Notes (Signed)
Occupational Therapy Treatment Patient Details Name: Jason Watts MRN: [id number] DOB: Dec 06, 2000 Today's Date: 10/08/2018    History of present illness Pt is a 18 yo male who slipped and fell at work onto his L hip, resulting in an intertrochanteric femur fracture that required surgical repair (IM nail). PMH: cerebral palsy.   OT comments  Pt progressing towards established OT goals. Provided pt with education on shower transfer. Pt performing simulated shower transfer with RW and 3N1. Pt required Min Guard A for safety during shower transfer and functional mobility. Pt continues to decreased WBing through LLE due to pain. Planning on dc home today and answered questions in preparation. All acute OT needs met and will sign off.    Follow Up Recommendations  No OT follow up;Supervision/Assistance - 24 hour    Equipment Recommendations  3 in 1 bedside commode    Recommendations for Other Services PT consult    Precautions / Restrictions Precautions Precautions: Fall Restrictions Weight Bearing Restrictions: Yes LLE Weight Bearing: Weight bearing as tolerated       Mobility Bed Mobility               General bed mobility comments: In recliner upon arrival  Transfers Overall transfer level: Needs assistance Equipment used: Rolling walker (2 wheeled) Transfers: Sit to/from Stand Sit to Stand: Min guard         General transfer comment: Min guard for safety. Educating pt on hand placement and safety with RW use    Balance Overall balance assessment: Needs assistance Sitting-balance support: Feet supported;No upper extremity supported Sitting balance-Leahy Scale: Normal     Standing balance support: Bilateral upper extremity supported;During functional activity Standing balance-Leahy Scale: Fair Standing balance comment: Able to maintain static standing during LB dressing                           ADL either performed or assessed with clinical  judgement   ADL Overall ADL's : Needs assistance/impaired                         Toilet Transfer: Min guard;Ambulation;RW(simulated to recliner)       Tub/ Shower Transfer: Walk-in shower;Min guard;Ambulation;3 in 1;Rolling walker Tub/Shower Transfer Details (indicate cue type and reason): Educating pt on shower transfer techniques and use of 3N1. Pt demonstrated understanding with MIn Guard A for safety Functional mobility during ADLs: Min guard;Rolling walker General ADL Comments: Continues to present with decreased weight bearing through LLE     Vision       Perception     Praxis      Cognition Arousal/Alertness: Awake/alert Behavior During Therapy: WFL for tasks assessed/performed Overall Cognitive Status: Within Functional Limits for tasks assessed                                 General Comments: Verbalized frustration about functional status and beinging in the hospital        Exercises     Shoulder Instructions       General Comments Mother present    Pertinent Vitals/ Pain       Pain Assessment: Faces Faces Pain Scale: Hurts even more Pain Location: LLE Pain Descriptors / Indicators: Operative site guarding;Grimacing;Sore Pain Intervention(s): Monitored during session;Limited activity within patient's tolerance;Repositioned  Home Living  Prior Functioning/Environment              Frequency  Min 2X/week        Progress Toward Goals  OT Goals(current goals can now be found in the care plan section)  Progress towards OT goals: Progressing toward goals  Acute Rehab OT Goals Patient Stated Goal: go home OT Goal Formulation: With patient Time For Goal Achievement: 10/21/18 Potential to Achieve Goals: Good ADL Goals Pt Will Perform Lower Body Dressing: with modified independence;sit to/from stand Pt Will Transfer to Toilet: with modified independence;regular  height toilet;bedside commode Pt Will Perform Tub/Shower Transfer: Shower transfer;with modified independence;rolling walker;3 in 1  Plan Discharge plan remains appropriate;All goals met and education completed, patient discharged from OT services    Co-evaluation                 AM-PAC OT "6 Clicks" Daily Activity     Outcome Measure   Help from another person eating meals?: None Help from another person taking care of personal grooming?: None Help from another person toileting, which includes using toliet, bedpan, or urinal?: A Little Help from another person bathing (including washing, rinsing, drying)?: A Little Help from another person to put on and taking off regular upper body clothing?: None Help from another person to put on and taking off regular lower body clothing?: A Little 6 Click Score: 21    End of Session Equipment Utilized During Treatment: Rolling walker;Gait belt  OT Visit Diagnosis: Unsteadiness on feet (R26.81);Other abnormalities of gait and mobility (R26.89);Muscle weakness (generalized) (M62.81);Pain Pain - Right/Left: Left Pain - part of body: Leg   Activity Tolerance Patient tolerated treatment well   Patient Left in chair;with call bell/phone within reach;with family/visitor present   Nurse Communication Mobility status        Time: 0910-0930 OT Time Calculation (min): 20 min  Charges: OT General Charges $OT Visit: 1 Visit OT Treatments $Self Care/Home Management : 8-22 mins  [place], OTR/L Acute Rehab Pager: [phone] Office: [place] 10/08/2018, 10:10 AM

## 2018-10-09 ENCOUNTER — Encounter: Payer: Self-pay | Admitting: Student

## 2018-10-09 DIAGNOSIS — G809 Cerebral palsy, unspecified: Secondary | ICD-10-CM | POA: Insufficient documentation

## 2020-03-15 IMAGING — CR DG HIP (WITH OR WITHOUT PELVIS) 2-3V*L*
3 series · 3 of 3 positions shown · non-contrast
Comparison: None.

CLINICAL DATA: Pain after fall.

EXAM:
DG HIP (WITH OR WITHOUT PELVIS) 2-3V LEFT

[t pelvis a.p.]
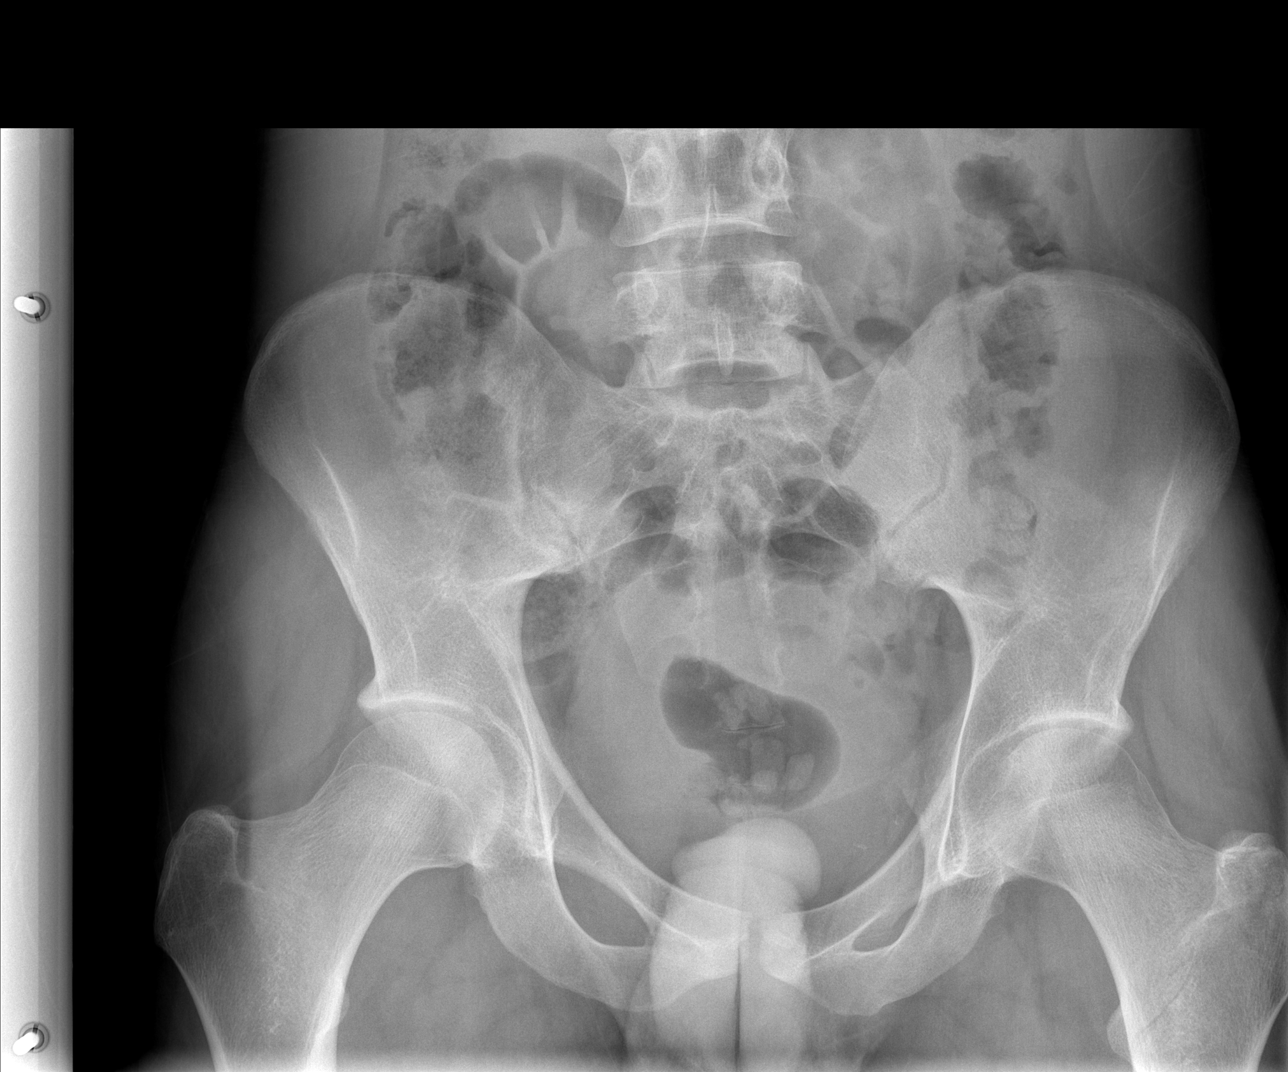

[t hip ap left]
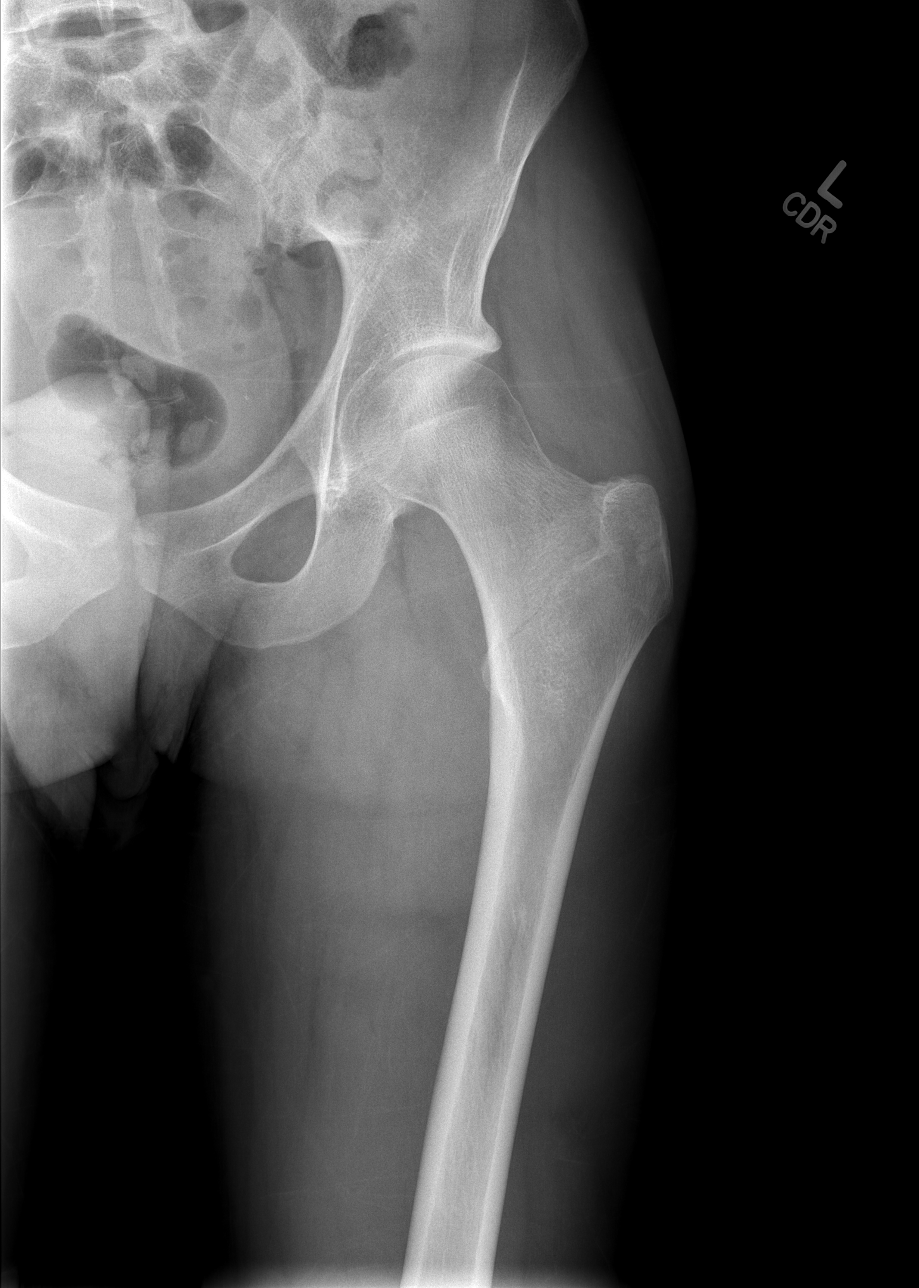

[t hip frog leg left]
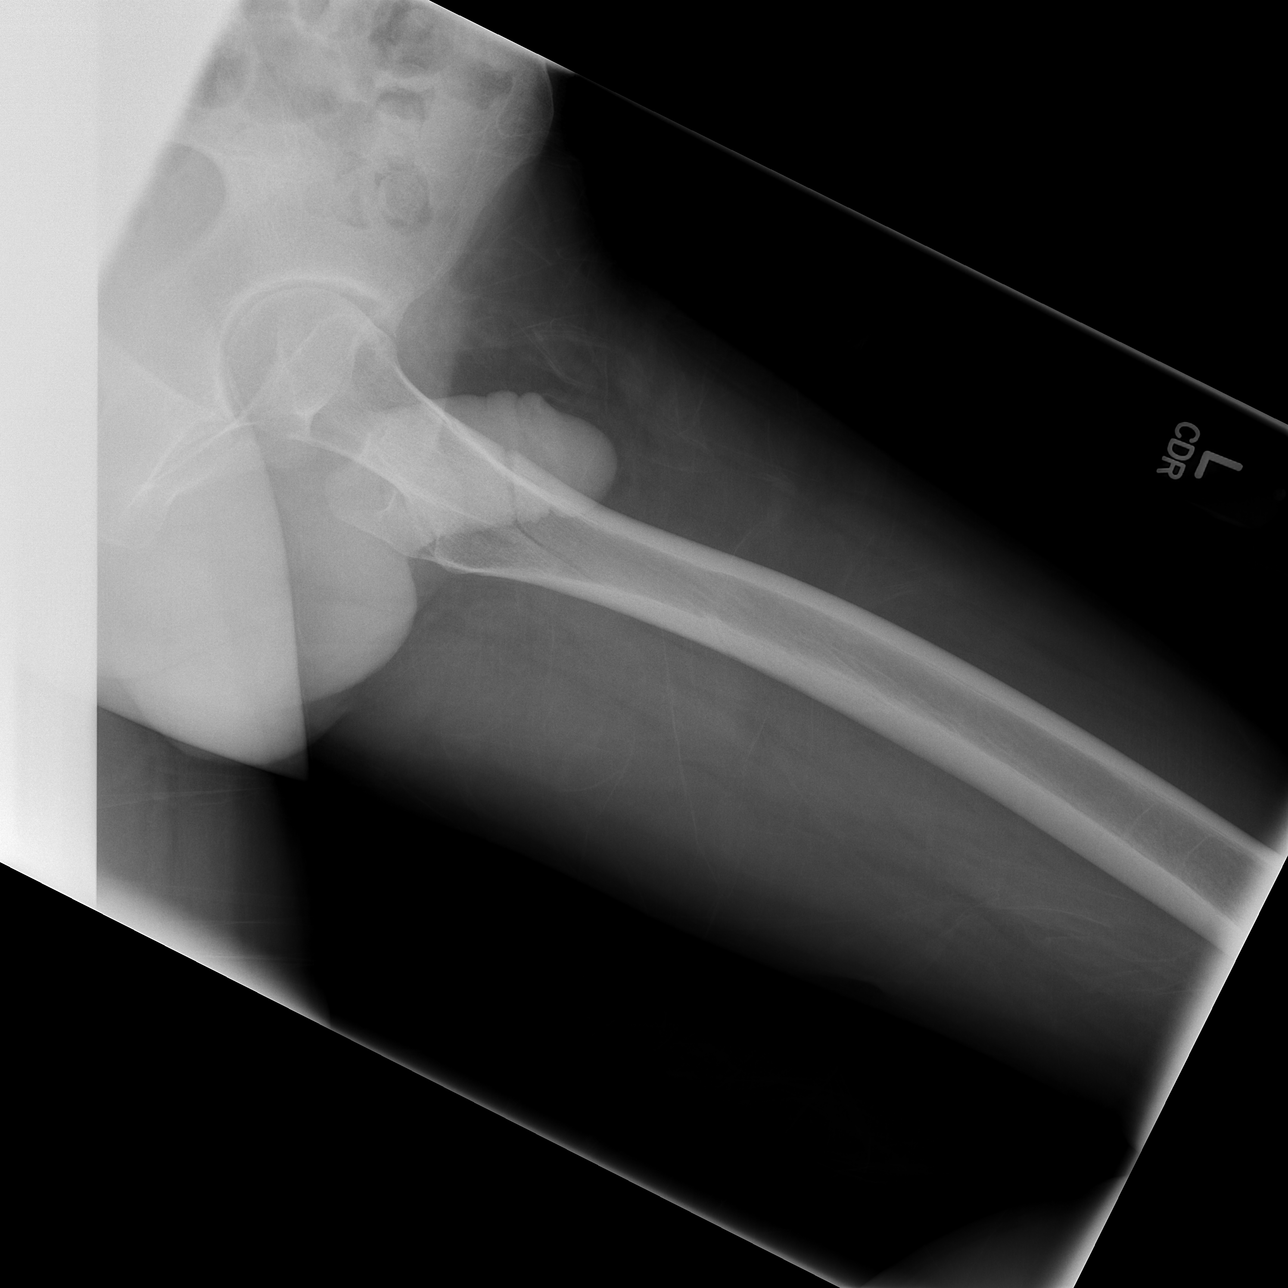

[3 of 3 positions shown; findings below may reference images not displayed]

FINDINGS: There appears to be a nondisplaced fracture through the left
intertrochanteric region on all three views. No other abnormalities.
IMPRESSION: Nondisplaced fracture through the intertrochanteric region on the
left.

## 2021-03-02 ENCOUNTER — Emergency Department (HOSPITAL_BASED_OUTPATIENT_CLINIC_OR_DEPARTMENT_OTHER)
Admission: EM | Admit: 2021-03-02 | Discharge: 2021-03-02 | Disposition: A | Discharge status: Home / Self Care | Payer: BC Managed Care – PPO | Attending: Emergency Medicine | Admitting: Emergency Medicine

## 2021-03-02 ENCOUNTER — Encounter (HOSPITAL_BASED_OUTPATIENT_CLINIC_OR_DEPARTMENT_OTHER): Payer: Self-pay | Admitting: *Deleted

## 2021-03-02 ENCOUNTER — Other Ambulatory Visit: Payer: Self-pay

## 2021-03-02 DIAGNOSIS — Z79899 Other long term (current) drug therapy: Secondary | ICD-10-CM | POA: Diagnosis not present

## 2021-03-02 DIAGNOSIS — F419 Anxiety disorder, unspecified: Secondary | ICD-10-CM | POA: Insufficient documentation

## 2021-03-02 DIAGNOSIS — Z7982 Long term (current) use of aspirin: Secondary | ICD-10-CM | POA: Insufficient documentation

## 2021-03-02 DIAGNOSIS — R454 Irritability and anger: Secondary | ICD-10-CM | POA: Diagnosis present

## 2021-03-02 NOTE — ED Notes (Signed)
This Pt. Is here due to feeling bad for things he said in an argument today and needing to possibly get counseling for past history.  Pt. In no distress and having no SI or HI.  Pt. Is willing to speak freely and is only here due to feeling remorse for his actions today in front of his family and saying things he said in the heat of the moment per the Pt.

## 2021-03-02 NOTE — ED Triage Notes (Signed)
States he has mood swings and depression. Denies SI /HI. He would like to get a referral to have a counselor.

## 2021-03-02 NOTE — ED Provider Notes (Signed)
[facility] EMERGENCY DEPARTMENT Provider Note  CSN: [id number] Arrival date & time: 03/02/21 2055  Chief Complaint(s) No chief complaint on file.  HPI Jason Watts is a 20 y.o. male  The history is provided by the patient.  Mental Health Problem Presenting symptoms comment:  Holding emotions in. Would like to speak with someone. Degree of incapacity (severity):  Mild Onset quality:  Gradual Duration: years. Context: not alcohol use   Treatment compliance:  Untreated Relieved by:  None tried Associated symptoms: irritability   Associated symptoms: no anxiety, no appetite change, no fatigue, no feelings of worthlessness and no poor judgment    Past Medical History Past Medical History:  Diagnosis Date   Cerebral palsy (HCC)    left side   Patient Active Problem List   Diagnosis Date Noted   Cerebral palsy (HCC) 10/09/2018   Closed fracture of femur, intertrochanteric, left, initial encounter (HCC) 10/05/2018   Intertrochanteric fracture, closed, left, initial encounter (HCC) 10/05/2018   Home Medication(s) Prior to Admission medications   Medication Sig Start Date End Date Taking? Authorizing Provider  acetaminophen (TYLENOL) 500 MG tablet Take 1 tablet (500 mg total) by mouth every 12 (twelve) hours. 10/08/18   Despina Hidden, PA-C  aspirin 325 MG tablet Take 1 tablet (325 mg total) by mouth daily. 10/08/18   Despina Hidden, PA-C  gabapentin (NEURONTIN) 100 MG capsule Take 1 capsule (100 mg total) by mouth 3 (three) times daily for 7 days. 10/08/18 10/15/18  Despina Hidden, PA-C  methocarbamol (ROBAXIN) 500 MG tablet Take 1 tablet (500 mg total) by mouth every 6 (six) hours as needed for muscle spasms. 10/08/18   Despina Hidden, PA-C  oxyCODONE (OXY IR/ROXICODONE) 5 MG immediate release tablet Take 1 tablet (5 mg total) by mouth every 4 (four) hours as needed for moderate pain or severe pain. 10/08/18   Bebe Liter                                                                                                                                     Past Surgical History Past Surgical History:  Procedure Laterality Date   ABDOMINAL SURGERY     HERNIA REPAIR     INTRAMEDULLARY (IM) NAIL INTERTROCHANTERIC Left 10/06/2018   Procedure: INTRAMEDULLARY (IM) NAIL INTERTROCHANTRIC;  Surgeon: Roby Lofts, MD;  Location: [facility] OR;  Service: Orthopedics;  Laterality: Left;   Family History No family history on file.  Social History Social History   Tobacco Use   Smoking status: Never   Smokeless tobacco: Never  Vaping Use   Vaping Use: Never used  Substance Use Topics   Alcohol use: No   Drug use: Never   Allergies Patient has no known allergies.  Review of Systems Review of Systems  Constitutional:  Positive for irritability. Negative for appetite change and fatigue.  Psychiatric/Behavioral:  The patient is not nervous/anxious.  All other systems are reviewed and are negative for acute change except as noted in the HPI  Physical Exam Vital Signs  I have reviewed the triage vital signs BP (!) 136/93 (BP Location: Right Arm)   Pulse 80   Temp 98.4 F (36.9 C) (Oral)   Resp 18   Ht 6' (1.829 m)   Wt 63.5 kg   SpO2 97%   BMI 18.99 kg/m   Physical Exam Vitals reviewed.  Constitutional:      General: He is not in acute distress.    Appearance: He is well-developed. He is not diaphoretic.  HENT:     Head: Normocephalic and atraumatic.     Right Ear: External ear normal.     Left Ear: External ear normal.     Nose: Nose normal.     Mouth/Throat:     Mouth: Mucous membranes are moist.  Eyes:     General: No scleral icterus.    Conjunctiva/sclera: Conjunctivae normal.  Neck:     Trachea: Phonation normal.  Cardiovascular:     Rate and Rhythm: Normal rate and regular rhythm.  Pulmonary:     Effort: Pulmonary effort is normal. No respiratory distress.     Breath sounds: No stridor.  Abdominal:     General: There is no  distension.  Musculoskeletal:        General: Normal range of motion.     Cervical back: Normal range of motion.  Neurological:     Mental Status: He is alert and oriented to person, place, and time.  Psychiatric:        Behavior: Behavior normal.    ED Results and Treatments Labs (all labs ordered are listed, but only abnormal results are displayed) Labs Reviewed - No data to display                                                                                                                       EKG  EKG Interpretation  Date/Time:    Ventricular Rate:    PR Interval:    QRS Duration:   QT Interval:    QTC Calculation:   R Axis:     Text Interpretation:         Radiology No results found.  Pertinent labs & imaging results that were available during my care of the patient were reviewed by me and considered in my medical decision making (see MDM for details).  Medications Ordered in ED Medications - No data to display  Procedures Procedures  (including critical care time)  Medical Decision Making / ED Course I have reviewed the nursing notes for this encounter and the patient's prior records (if available in EHR or on provided paperwork).  Jason Watts was evaluated in Emergency Department on 03/02/2021 for the symptoms described in the history of present illness. He was evaluated in the context of the global COVID-19 pandemic, which necessitated consideration that the patient might be at risk for infection with the SARS-CoV-2 virus that causes COVID-19. Institutional protocols and algorithms that pertain to the evaluation of patients at risk for COVID-19 are in a state of rapid change based on information released by regulatory bodies including the CDC and federal and state organizations. These policies and algorithms were followed during  the patient's care in the ED.     Patient presents with episodes of anxiety.  Would like resources regarding behavioral health/therapy. Denies any SI, HI.  No AVH.   Final Clinical Impression(s) / ED Diagnoses Final diagnoses:  Anxiousness   The patient appears reasonably screened and/or stabilized for discharge and I doubt any other medical condition or other [facility] requiring further screening, evaluation, or treatment in the ED at this time prior to discharge. Safe for discharge with strict return precautions.  Disposition: Discharge  Condition: Good  I have discussed the results, Dx and Tx plan with the patient/family who expressed understanding and agree(s) with the plan. Discharge instructions discussed at length. The patient/family was given strict return precautions who verbalized understanding of the instructions. No further questions at time of discharge.    ED Discharge Orders     None        Follow Up: [facility] Address: [address], [place], [place] [address] Hours: Monday-Thursday between 8 am and 11 am for outpatient treatment Phone: [phone] Go to    Timothy Lasso, MD [address] [place] [address] [phone]  Call  as needed     This chart was dictated using voice recognition software.  Despite best efforts to proofread,  errors can occur which can change the documentation meaning.    Nira Conn, MD 03/02/21 [phone]

## 2023-03-21 ENCOUNTER — Ambulatory Visit
Admission: EM | Admit: 2023-03-21 | Discharge: 2023-03-21 | Disposition: A | Discharge status: Home / Self Care | Payer: BC Managed Care – PPO | Attending: Internal Medicine | Admitting: Internal Medicine

## 2023-03-21 ENCOUNTER — Encounter: Payer: Self-pay | Admitting: Urgent Care

## 2023-03-21 DIAGNOSIS — J029 Acute pharyngitis, unspecified: Secondary | ICD-10-CM

## 2023-03-21 LAB — POCT RAPID STREP A (OFFICE): Rapid Strep A Screen: NEGATIVE

## 2023-03-21 MED ORDER — AMOXICILLIN-POT CLAVULANATE 875-125 MG PO TABS: 1.0000 | ORAL_TABLET | Freq: Two times a day (BID) | ORAL | 0 refills | Status: DC

## 2023-03-21 MED ORDER — CEFTRIAXONE SODIUM 1 G IJ SOLR
1.0000 g | Freq: Once | INTRAMUSCULAR | Status: AC
  Administered 2023-03-21: 1 g via INTRAMUSCULAR

## 2023-03-21 MED ORDER — IBUPROFEN 800 MG PO TABS: 800.0000 mg | ORAL_TABLET | Freq: Three times a day (TID) | ORAL | 0 refills | Status: DC

## 2023-03-21 NOTE — ED Provider Notes (Signed)
[facility] - URGENT CARE CENTER  Note:  This document was prepared using Conservation officer, historic buildings and may include unintentional dictation errors.  MRN: [id number] DOB: August 14, 2000  Subjective:   Jason Watts is a 22 y.o. male presenting for 11-day history of acute onset persistent moderate to severe throat pain, painful swallowing, difficulty opening up his mouth.  Pain is radiating into his jaw and also his right ear.  Has used over-the-counter ibuprofen without any relief.  No current facility-administered medications for this encounter.  Current Outpatient Medications:    ibuprofen (ADVIL) 200 MG tablet, Take 200 mg by mouth every 6 (six) hours as needed., Disp: , Rfl:    acetaminophen (TYLENOL) 500 MG tablet, Take 1 tablet (500 mg total) by mouth every 12 (twelve) hours., Disp: 60 tablet, Rfl: 1   aspirin 325 MG tablet, Take 1 tablet (325 mg total) by mouth daily., Disp: 30 tablet, Rfl: 0   gabapentin (NEURONTIN) 100 MG capsule, Take 1 capsule (100 mg total) by mouth 3 (three) times daily for 7 days., Disp: 21 capsule, Rfl: 0   methocarbamol (ROBAXIN) 500 MG tablet, Take 1 tablet (500 mg total) by mouth every 6 (six) hours as needed for muscle spasms., Disp: 28 tablet, Rfl: 0   oxyCODONE (OXY IR/ROXICODONE) 5 MG immediate release tablet, Take 1 tablet (5 mg total) by mouth every 4 (four) hours as needed for moderate pain or severe pain., Disp: 42 tablet, Rfl: 0   No Known Allergies  Past Medical History:  Diagnosis Date   Cerebral palsy (HCC)    left side     Past Surgical History:  Procedure Laterality Date   ABDOMINAL SURGERY     HERNIA REPAIR     INTRAMEDULLARY (IM) NAIL INTERTROCHANTERIC Left 10/06/2018   Procedure: INTRAMEDULLARY (IM) NAIL INTERTROCHANTRIC;  Surgeon: Roby Lofts, MD;  Location: [facility] OR;  Service: Orthopedics;  Laterality: Left;    History reviewed. No pertinent family history.  Social History   Tobacco Use   Smoking status: Never    Smokeless tobacco: Never  Vaping Use   Vaping status: Never Used  Substance Use Topics   Alcohol use: No   Drug use: Never    ROS   Objective:   Vitals: BP (!) 153/95 (BP Location: Right Arm)   Pulse 93   Temp 98.4 F (36.9 C) (Oral)   Resp 18   SpO2 95%   Physical Exam Constitutional:      General: He is not in acute distress.    Appearance: Normal appearance. He is well-developed and normal weight. He is not ill-appearing, toxic-appearing or diaphoretic.  HENT:     Head: Normocephalic and atraumatic.     Right Ear: External ear normal.     Left Ear: External ear normal.     Nose: Nose normal.     Mouth/Throat:     Mouth: Mucous membranes are moist.     Pharynx: Pharyngeal swelling and posterior oropharyngeal erythema present. No oropharyngeal exudate or uvula swelling.     Tonsils: No tonsillar exudate or tonsillar abscesses. 2+ on the right. 0 on the left.     Comments: Muffled voice noted. Eyes:     General: No scleral icterus.       Right eye: No discharge.        Left eye: No discharge.     Extraocular Movements: Extraocular movements intact.  Cardiovascular:     Rate and Rhythm: Normal rate.  Pulmonary:  Effort: Pulmonary effort is normal.  Musculoskeletal:     Cervical back: Normal range of motion.  Neurological:     Mental Status: He is alert and oriented to person, place, and time.  Psychiatric:        Mood and Affect: Mood normal.        Behavior: Behavior normal.        Thought Content: Thought content normal.        Judgment: Judgment normal.     Results for orders placed or performed during the hospital encounter of 03/21/23 (from the past 24 hour(s))  POCT rapid strep A     Status: None   Collection Time: 03/21/23  3:05 PM  Result Value Ref Range   Rapid Strep A Screen Negative Negative   IM ceftriaxone 1000 mg administered in clinic.  Assessment and Plan :   PDMP not reviewed this encounter.  1. Acute pharyngitis, unspecified  etiology    Empiric treatment for acute pharyngitis with IM ceftriaxone, Augmentin as an outpatient.  Maintain strict ER precautions as it is distinctly possible, patient has retropharyngeal abscess, tonsillar abscess.  For now given that his vital signs are hemodynamically stable, will use outpatient management.  Counseled patient on potential for adverse effects with medications prescribed/recommended today, ER and return-to-clinic precautions discussed, patient verbalized understanding.    Wallis Bamberg, [place] 03/21/23 1549

## 2023-03-21 NOTE — ED Triage Notes (Signed)
Pt reports sore throat x 10-11 days; right ear pain and jaw pain x 4 days. Ibuprofen gives no relief.

## 2023-03-21 NOTE — Discharge Instructions (Signed)
You have received an injection of an antibiotic called ceftriaxone to help start your treatment for your throat infection, acute pharyngitis. Start taking Augmentin today. Use ibuprofen 800mg  every 8 hours for pain and inflammation. If you have no improvement and definitely if you are worse by tomorrow afternoon, go straight to the hospital.

## 2023-03-22 ENCOUNTER — Encounter (HOSPITAL_BASED_OUTPATIENT_CLINIC_OR_DEPARTMENT_OTHER): Payer: Self-pay | Admitting: *Deleted

## 2023-03-22 ENCOUNTER — Emergency Department (HOSPITAL_BASED_OUTPATIENT_CLINIC_OR_DEPARTMENT_OTHER)
Admission: EM | Admit: 2023-03-22 | Discharge: 2023-03-22 | Disposition: A | Discharge status: Home / Self Care | Payer: BC Managed Care – PPO | Attending: Emergency Medicine | Admitting: Emergency Medicine

## 2023-03-22 ENCOUNTER — Emergency Department (HOSPITAL_BASED_OUTPATIENT_CLINIC_OR_DEPARTMENT_OTHER): Payer: BC Managed Care – PPO

## 2023-03-22 ENCOUNTER — Other Ambulatory Visit: Payer: Self-pay

## 2023-03-22 DIAGNOSIS — J36 Peritonsillar abscess: Secondary | ICD-10-CM | POA: Diagnosis not present

## 2023-03-22 DIAGNOSIS — J029 Acute pharyngitis, unspecified: Secondary | ICD-10-CM | POA: Diagnosis present

## 2023-03-22 LAB — BASIC METABOLIC PANEL
Anion gap: 12 (ref 5–15)
BUN: 15 mg/dL (ref 6–20)
CO2: 22 mmol/L (ref 22–32)
Calcium: 9.1 mg/dL (ref 8.9–10.3)
Chloride: 105 mmol/L (ref 98–111)
Creatinine, Ser: 0.77 mg/dL (ref 0.61–1.24)
GFR, Estimated: >60 mL/min (ref >60)
Glucose, Bld: 79 mg/dL (ref 70–99)
Potassium: 4 mmol/L (ref 3.5–5.1)
Sodium: 139 mmol/L (ref 135–145)

## 2023-03-22 LAB — CBC
HCT: 48.6 % (ref 39.0–52.0)
Hemoglobin: 16.7 g/dL (ref 13.0–17.0)
MCH: 30.8 pg (ref 26.0–34.0)
MCHC: 34.4 g/dL (ref 30.0–36.0)
MCV: 89.5 fL (ref 80.0–100.0)
Platelets: 252 10*3/uL (ref 150–400)
RBC: 5.43 MIL/uL (ref 4.22–5.81)
RDW: 11.7 % (ref 11.5–15.5)
WBC: 16.5 10*3/uL — ABNORMAL HIGH (ref 4.0–10.5)
nRBC: 0 % (ref 0.0–0.2)

## 2023-03-22 MED ORDER — CLINDAMYCIN HCL 300 MG PO CAPS
300.0000 mg | ORAL_CAPSULE | Freq: Three times a day (TID) | ORAL | 0 refills | Status: AC
Start: 2023-03-22 — End: 2023-03-29

## 2023-03-22 MED ORDER — BENZOCAINE 20 % MT AERO
INHALATION_SPRAY | Freq: Once | OROMUCOSAL | Status: AC
  Administered 2023-03-22: 1 via OROMUCOSAL
  Filled 2023-03-22: qty 57

## 2023-03-22 MED ORDER — KETOROLAC TROMETHAMINE 15 MG/ML IJ SOLN
15.0000 mg | Freq: Once | INTRAMUSCULAR | Status: AC
  Administered 2023-03-22: 15 mg via INTRAVENOUS
  Filled 2023-03-22: qty 1

## 2023-03-22 MED ORDER — DEXAMETHASONE SODIUM PHOSPHATE 10 MG/ML IJ SOLN
10.0000 mg | Freq: Once | INTRAMUSCULAR | Status: AC
  Administered 2023-03-22: 10 mg via INTRAVENOUS
  Filled 2023-03-22: qty 1

## 2023-03-22 NOTE — ED Notes (Signed)
Dr. Doran Durand at [facility] for R RPA I&D. 4cc specimen sent to lab. Tolerated well.

## 2023-03-22 NOTE — Discharge Instructions (Signed)
Your abscess was drained in the emergency department today.  You should start to feel better and the next 24 hours.  I do want you to discontinue the Augmentin that you have been taking and take the clindamycin that we prescribed for you instead.  We did culture the drainage from the abscess and if a different antibiotic is required then someone will call you.  However, this will not happen for a few days you need to go ahead and fill the clindamycin as it will cover the most common causes of these abscesses.  I have also given you a referral to an ear nose and throat doctor to call to schedule an appointment for follow-up.  Please call them at your earliest convenience.  Follow-up with your primary doctor as well.  Return if development of any new or worsening symptoms.

## 2023-03-22 NOTE — ED Provider Notes (Signed)
[facility] Provider Note   CSN: [id number] Arrival date & time: 03/22/23  1429     History  Chief Complaint  Patient presents with   Sore Throat    Jason Watts is a 22 y.o. male.  Patient with noncontributory past medical history presents today with complaints of sore throat.  He states that same began approximately 2 weeks ago and has been persistently worsening since then.  He went to urgent care yesterday and had a negative strep swab and was given IM Rocephin and Augmentin with diagnosis of pharyngitis.  He states that since then his swelling has gotten worse to a point that he is having to spit in a cup.  He notes that his voice is muffled as well.  Denies any fevers or chills.  No trouble breathing.  No known sick contacts.  The history is provided by the patient. No language interpreter was used.  Sore Throat       Home Medications Prior to Admission medications   Medication Sig Start Date End Date Taking? Authorizing Provider  acetaminophen (TYLENOL) 500 MG tablet Take 1 tablet (500 mg total) by mouth every 12 (twelve) hours. 10/08/18   West Bali, PA-C  amoxicillin-clavulanate (AUGMENTIN) 875-125 MG tablet Take 1 tablet by mouth 2 (two) times daily. 03/21/23   Wallis Bamberg, PA-C  aspirin 325 MG tablet Take 1 tablet (325 mg total) by mouth daily. 10/08/18   West Bali, PA-C  gabapentin (NEURONTIN) 100 MG capsule Take 1 capsule (100 mg total) by mouth 3 (three) times daily for 7 days. 10/08/18 10/15/18  West Bali, PA-C  ibuprofen (ADVIL) 800 MG tablet Take 1 tablet (800 mg total) by mouth 3 (three) times daily. 03/21/23   Wallis Bamberg, PA-C  methocarbamol (ROBAXIN) 500 MG tablet Take 1 tablet (500 mg total) by mouth every 6 (six) hours as needed for muscle spasms. 10/08/18   West Bali, PA-C  oxyCODONE (OXY IR/ROXICODONE) 5 MG immediate release tablet Take 1 tablet (5 mg total) by mouth every 4 (four) hours  as needed for moderate pain or severe pain. 10/08/18   West Bali, PA-C      Allergies    Patient has no known allergies.    Review of Systems   Review of Systems  HENT:  Positive for sore throat, trouble swallowing and voice change.   All other systems reviewed and are negative.   Physical Exam Updated Vital Signs BP (!) 149/99 (BP Location: Left Arm)   Pulse 89   Temp 98.2 F (36.8 C) (Oral)   Resp 16   Wt 72.6 kg   SpO2 99%   BMI 21.70 kg/m  Physical Exam Vitals and nursing note reviewed.  Constitutional:      General: He is not in acute distress.    Appearance: Normal appearance. He is normal weight. He is not ill-appearing, toxic-appearing or diaphoretic.  HENT:     Head: Normocephalic and atraumatic.     Right Ear: Tympanic membrane and ear canal normal.     Left Ear: Tympanic membrane and ear canal normal.     Mouth/Throat:     Comments: Uvula deviation to the left with obvious right-sided peritonsillar abscess.  Patient is spitting into a cup on exam Cardiovascular:     Rate and Rhythm: Normal rate.  Pulmonary:     Effort: Pulmonary effort is normal. No respiratory distress.  Musculoskeletal:  General: Normal range of motion.     Cervical back: Normal range of motion.  Skin:    General: Skin is warm and dry.  Neurological:     General: No focal deficit present.     Mental Status: He is alert.  Psychiatric:        Mood and Affect: Mood normal.        Behavior: Behavior normal.     ED Results / Procedures / Treatments   Labs (all labs ordered are listed, but only abnormal results are displayed) Labs Reviewed  CBC - Abnormal; Notable for the following components:      Result Value   WBC 16.5 (*)    All other components within normal limits  BODY FLUID CULTURE W GRAM STAIN  BASIC METABOLIC PANEL    EKG None  Radiology No results found.  Procedures Procedures    Medications Ordered in ED Medications  dexamethasone (DECADRON)  injection 10 mg (10 mg Intravenous Given 03/22/23 1543)  ketorolac (TORADOL) 15 MG/ML injection 15 mg (15 mg Intravenous Given 03/22/23 1541)  Benzocaine (HURRCAINE) 20 % mouth spray (1 Application Mouth/Throat Given by Other 03/22/23 1652)    ED Course/ Medical Decision Making/ A&P                                 Medical Decision Making Amount and/or Complexity of Data Reviewed Labs: ordered.  Risk Prescription drug management.   Patient presents today with complaints of sore throat x 2 weeks.  He is afebrile, nontoxic-appearing, and in no acute distress with reassuring vital signs.  Physical exam reveals obvious right-sided peritonsillar abscess.  Patient is unable to tolerate secretions.  Chart reviewed, patient was seen at urgent care yesterday had a negative strep swab and was diagnosed with pharyngitis and given IM Rocephin and Augmentin to go home with.  Patient given IV Decadron and Toradol for pain with improvement of symptoms.  Laboratory evaluation reveals leukocytosis at 16 consistent with patient's peritonsillar abscess.  Abscess has been drained by EDP Dr. Doran Durand which was well-tolerated.  See their procedure note for further details.  After monitoring, patient's symptoms have significantly improved and he is able to tolerate secretions without issue.  Plan for discharge with ENT follow-up and clindamycin.  Patient informed to discontinue the Augmentin. Evaluation and diagnostic testing in the emergency department does not suggest an emergent condition requiring admission or immediate intervention beyond what has been performed at this time.  Plan for discharge with close PCP follow-up.  Patient is understanding and amenable with plan, educated on red flag symptoms that would prompt immediate return.  Patient discharged in stable condition.   This is a shared visit with supervising physician Dr. Doran Durand who has independently evaluated patient & provided guidance in  evaluation/management/disposition, in agreement with care   Final Clinical Impression(s) / ED Diagnoses Final diagnoses:  Peritonsillar abscess    Rx / DC Orders ED Discharge Orders          Ordered    clindamycin (CLEOCIN) 300 MG capsule  3 times daily        03/22/23 1814          An After Visit Summary was printed and given to the patient.     Vear Clock 03/22/23 1912    Glyn Ade, MD 03/25/23 1740

## 2023-03-22 NOTE — ED Triage Notes (Signed)
Here by POV from home for sore throat. Onset 10d ago. Seen at [facility] yesterday. Strep was negative. Given injection and prescription for BID antibiotic and TID ibuprofen. Has had 3 doses of the antibiotic. Significant swelling present bilaterally, R>L. Denies sx on the L. Denies fever. Alert, NAD, calm. Last ibuprofen at 1200.

## 2023-03-22 NOTE — ED Provider Notes (Signed)
I was consulted for evaluation of this patient. Mr. Jason Watts is a well-appearing 22 year old male presenting with obvious peritonsillar abscess.  Performed a point-of-care ultrasound from the external position that demonstrated an approximately 5 cc fluid collection in the superior lateral space of his right tonsil. Attempted to perform a internal view with the cardiac probe under sterile probe cover but patient could not tolerate due to trismus.  Patient consented for aspiration of the abscess material. Approximately 4.2 cc of grossly purulent material were extracted.  A postprocedural point-of-care ultrasound was performed that demonstrated a residual of approximately 0.75 cc. He has had interval improvement of his symptoms.  The abscess location was fenestrated and is still draining on my external review.  No need for repeat approach.  Will continue patient on antibiosis and recommend anti-inflammatories, clindamycin.  He will be treated in the emergency department dexamethasone for symptom management and will be recommended for follow-up with ENT.  EMERGENCY DEPARTMENT US SOFT TISSUE INTERPRETATION "Study: Limited Soft Tissue Ultrasound"  INDICATIONS: Pain and Soft tissue infection Multiple views of the body part were obtained in real-time with a multi-frequency linear probe  PERFORMED BY: Myself IMAGES ARCHIVED?: Yes SIDE:Right  BODY PART:Neck INTERPRETATION:  Abcess present  Repeated ultrasound post procedure and fluid collection down to 0.75 cc.  Still draining by the opening.  Marland Kitchen.Incision and Drainage  Date/Time: 03/22/2023 5:18 PM  Performed by: Glyn Ade, MD Authorized by: Glyn Ade, MD   Consent:    Consent obtained:  Verbal   Risks, benefits, and alternatives were discussed: yes     Risks discussed:  Bleeding, incomplete drainage, pain and damage to other organs   Alternatives discussed:  No treatment, alternative treatment and referral Location:     Location:  Mouth   Mouth location:  Peritonsillar Procedure type:    Complexity:  Complex Procedure details:    Ultrasound guidance: yes     Needle aspiration: yes     Needle size:  18 G   Drainage:  Purulent   Drainage amount:  Copious   Wound treatment:  Wound left open   Packing materials:  None        Glyn Ade, MD 03/22/23 1721

## 2023-03-22 NOTE — ED Notes (Signed)
ED Provider at bedside. 

## 2023-03-27 LAB — BODY FLUID CULTURE W GRAM STAIN: Special Requests: NORMAL

## 2023-03-28 ENCOUNTER — Telehealth (HOSPITAL_BASED_OUTPATIENT_CLINIC_OR_DEPARTMENT_OTHER): Payer: Self-pay

## 2023-03-28 ENCOUNTER — Telehealth: Payer: Self-pay | Admitting: Otolaryngology

## 2023-03-28 NOTE — Telephone Encounter (Signed)
I tried to call and schedule based on ED referral we received stating pat has peritonsillar abscess. lvm

## 2023-03-28 NOTE — Telephone Encounter (Signed)
Post ED Visit - Positive Culture Follow-up  Culture report reviewed by antimicrobial stewardship pharmacist: [facility] [x]  Eldridge Scot, Pharm.D. BCCCP []  Celedonio Miyamoto, [address].D., BCPS AQ-ID []  Garvin Fila, Pharm.D., BCPS []  Georgina Pillion, Pharm.D., BCPS []  [place], [address].D., BCPS, AAHIVP []  Estella Husk, Pharm.D., BCPS, AAHIVP []  Lysle Pearl, PharmD, BCPS []  Phillips Climes, PharmD, BCPS []  Agapito Games, PharmD, BCPS []  Verlan Friends, PharmD []  Mervyn Gay, PharmD, BCPS []  Vinnie Level, PharmD  [facility] []  Len Childs, PharmD []  Greer Pickerel, PharmD []  Adalberto Cole, PharmD []  Perlie Gold, Rph []  Lonell Face) Jean Rosenthal, PharmD []  Earl Many, PharmD []  Junita Push, PharmD []  Dorna Leitz, PharmD []  Terrilee Files, PharmD []  Lynann Beaver, PharmD []  Keturah Barre, PharmD []  Loralee Pacas, PharmD []  Bernadene Person, PharmD   Positive body fluid culture Treated with Clindamycin, organism sensitive to the same and no further patient follow-up is required at this time.  Sandria Senter 03/28/2023, 1:07 PM

## 2024-05-06 ENCOUNTER — Ambulatory Visit
Admission: EM | Admit: 2024-05-06 | Discharge: 2024-05-06 | Disposition: A | Discharge status: Home / Self Care | Attending: Family Medicine | Admitting: Family Medicine

## 2024-05-06 DIAGNOSIS — M62838 Other muscle spasm: Secondary | ICD-10-CM | POA: Diagnosis not present

## 2024-05-06 DIAGNOSIS — R5383 Other fatigue: Secondary | ICD-10-CM

## 2024-05-06 NOTE — ED Triage Notes (Addendum)
 Pt present with fatigue and body aches have been getting worse over the past months. Pt states he has cerebral palsy.

## 2024-05-06 NOTE — Discharge Instructions (Addendum)
 The clinic will contact you with results of the blood work done today if positive.  Please focus on rest and hydration and follow-up with your PCP at your scheduled appointment for further workup of your symptoms.  Please go to the ER if you develop any worsening symptoms.  I hope you feel better soon!

## 2024-05-06 NOTE — ED Provider Notes (Signed)
 [facility]    CSN: [id number] Arrival date & time: 05/06/24  1204      History   Chief Complaint Chief Complaint  Patient presents with   Fatigue    HPI Jason Watts is a 23 y.o. male with a past medical history of cerebral palsy presents for fatigue and muscle spasms.  Patient reports over the past 6 to 8 months he has been having fatigue with bodyaches/muscle spasms.  States over the past month that seems to have worsened.  He does report he has had muscle stiffness secondary to his CP but never like this.  He denies any fevers, weight loss, N/V, dysuria, weakness.  No current or recent illnesses.  Denies sleep apnea.  He does not have a PCP and has not had routine blood work in several years.  He states he stays hydrated drinking lots of water.  He does not take any medications and denies any change in diet.  He does try to workout regularly.  No other concerns at this time  HPI  Past Medical History:  Diagnosis Date   Cerebral palsy (HCC)    left side    Patient Active Problem List   Diagnosis Date Noted   Cerebral palsy (HCC) 10/09/2018   Closed fracture of femur, intertrochanteric, left, initial encounter (HCC) 10/05/2018   Intertrochanteric fracture, closed, left, initial encounter (HCC) 10/05/2018    Past Surgical History:  Procedure Laterality Date   ABDOMINAL SURGERY     HERNIA REPAIR     INTRAMEDULLARY (IM) NAIL INTERTROCHANTERIC Left 10/06/2018   Procedure: INTRAMEDULLARY (IM) NAIL INTERTROCHANTRIC;  Surgeon: Kendal Franky SQUIBB, MD;  Location: [facility] OR;  Service: Orthopedics;  Laterality: Left;       Home Medications    Prior to Admission medications   Medication Sig Start Date End Date Taking? Authorizing Provider  acetaminophen  (TYLENOL ) 500 MG tablet Take 1 tablet (500 mg total) by mouth every 12 (twelve) hours. 10/08/18   Danton Lauraine LABOR, PA-C  amoxicillin -clavulanate (AUGMENTIN ) 875-125 MG tablet Take 1 tablet by mouth 2 (two) times daily.  03/21/23   Christopher Savannah, PA-C  aspirin  325 MG tablet Take 1 tablet (325 mg total) by mouth daily. 10/08/18   Danton Lauraine LABOR, PA-C  gabapentin  (NEURONTIN ) 100 MG capsule Take 1 capsule (100 mg total) by mouth 3 (three) times daily for 7 days. 10/08/18 10/15/18  Danton Lauraine LABOR, PA-C  ibuprofen  (ADVIL ) 800 MG tablet Take 1 tablet (800 mg total) by mouth 3 (three) times daily. 03/21/23   Christopher Savannah, PA-C  methocarbamol  (ROBAXIN ) 500 MG tablet Take 1 tablet (500 mg total) by mouth every 6 (six) hours as needed for muscle spasms. 10/08/18   Danton Lauraine LABOR, PA-C  oxyCODONE  (OXY IR/ROXICODONE ) 5 MG immediate release tablet Take 1 tablet (5 mg total) by mouth every 4 (four) hours as needed for moderate pain or severe pain. 10/08/18   Danton Lauraine LABOR, PA-C    Family History History reviewed. No pertinent family history.  Social History Social History   Tobacco Use   Smoking status: Never   Smokeless tobacco: Never  Vaping Use   Vaping status: Never Used  Substance Use Topics   Alcohol use: No   Drug use: Never     Allergies   Patient has no known allergies.   Review of Systems Review of Systems  Constitutional:  Positive for fatigue.  Musculoskeletal:  Positive for myalgias.     Physical Exam Triage Vital Signs ED Triage  Vitals  Encounter Vitals Group     BP 05/06/24 1213 (!) 159/108     Girls Systolic BP Percentile --      Girls Diastolic BP Percentile --      Boys Systolic BP Percentile --      Boys Diastolic BP Percentile --      Pulse Rate 05/06/24 1213 78     Resp 05/06/24 1213 16     Temp 05/06/24 1213 97.8 F (36.6 C)     Temp Source 05/06/24 1213 Oral     SpO2 05/06/24 1213 95 %     Weight --      Height --      Head Circumference --      Peak Flow --      Pain Score 05/06/24 1212 0     Pain Loc --      Pain Education --      Exclude from Growth Chart --    No data found.  Updated Vital Signs BP (!) 146/101   Pulse 78   Temp 97.8 F (36.6 C) (Oral)    Resp 16   SpO2 95%   Visual Acuity Right Eye Distance:   Left Eye Distance:   Bilateral Distance:    Right Eye Near:   Left Eye Near:    Bilateral Near:     Physical Exam Vitals and nursing note reviewed.  Constitutional:      General: He is not in acute distress.    Appearance: Normal appearance. He is not ill-appearing.  HENT:     Head: Normocephalic and atraumatic.  Eyes:     Pupils: Pupils are equal, round, and reactive to light.  Cardiovascular:     Rate and Rhythm: Normal rate.  Pulmonary:     Effort: Pulmonary effort is normal.  Skin:    General: Skin is warm and dry.  Neurological:     General: No focal deficit present.     Mental Status: He is alert and oriented to person, place, and time.  Psychiatric:        Mood and Affect: Mood normal.        Behavior: Behavior normal.      UC Treatments / Results  Labs (all labs ordered are listed, but only abnormal results are displayed) Labs Reviewed  COMPREHENSIVE METABOLIC PANEL WITH GFR  CBC WITH DIFFERENTIAL/PLATELET  MAGNESIUM    EKG   Radiology No results found.  Procedures Procedures (including critical care time)  Medications Ordered in UC Medications - No data to display  Initial Impression / Assessment and Plan / UC Course  I have reviewed the triage vital signs and the nursing notes.  Pertinent labs & imaging results that were available during my care of the patient were reviewed by me and considered in my medical decision making (see chart for details).     Reviewed exam and symptoms with patient.  Patient  presenting with chronic fatigue and muscle spasms that have worsened over the past month.  As patient does not have a PCP nursing staff was able to establish him with 1 but will do some basic blood work and have him follow-up with his PCP.  Discussed rest fluids and he should go to the emergency room if he develops any worsening symptoms before seeing his PCP and he verbalized  understanding. Final Clinical Impressions(s) / UC Diagnoses   Final diagnoses:  Other fatigue  Muscle spasm     Discharge Instructions  The clinic will contact you with results of the blood work done today if positive.  Please focus on rest and hydration and follow-up with your PCP at your scheduled appointment for further workup of your symptoms.  Please go to the ER if you develop any worsening symptoms.  I hope you feel better soon!     ED Prescriptions   None    PDMP not reviewed this encounter.   Loreda Myla SAUNDERS, NP 05/06/24 [phone]

## 2024-05-07 ENCOUNTER — Ambulatory Visit (HOSPITAL_COMMUNITY): Payer: Self-pay

## 2024-05-07 LAB — CBC WITH DIFFERENTIAL/PLATELET
Basophils Absolute: 0.1 x10E3/uL (ref 0.0–0.2)
Basos: 1 %
EOS (ABSOLUTE): 0.2 x10E3/uL (ref 0.0–0.4)
Eos: 2 %
Hematocrit: 52.3 % — ABNORMAL HIGH (ref 37.5–51.0)
Hemoglobin: 17.4 g/dL (ref 13.0–17.7)
Immature Grans (Abs): 0.1 x10E3/uL (ref 0.0–0.1)
Immature Granulocytes: 1 %
Lymphocytes Absolute: 2.3 x10E3/uL (ref 0.7–3.1)
Lymphs: 26 %
MCH: 31 pg (ref 26.6–33.0)
MCHC: 33.3 g/dL (ref 31.5–35.7)
MCV: 93 fL (ref 79–97)
Monocytes Absolute: 0.7 x10E3/uL (ref 0.1–0.9)
Monocytes: 8 %
Neutrophils Absolute: 5.4 x10E3/uL (ref 1.4–7.0)
Neutrophils: 62 %
Platelets: 240 x10E3/uL (ref 150–450)
RBC: 5.62 x10E6/uL (ref 4.14–5.80)
RDW: 12.7 % (ref 11.6–15.4)
WBC: 8.8 x10E3/uL (ref 3.4–10.8)

## 2024-05-07 LAB — COMPREHENSIVE METABOLIC PANEL WITH GFR
ALT: 92 IU/L — ABNORMAL HIGH (ref 0–44)
AST: 50 IU/L — ABNORMAL HIGH (ref 0–40)
Albumin: 4.6 g/dL (ref 4.3–5.2)
Alkaline Phosphatase: 82 IU/L (ref 47–123)
BUN/Creatinine Ratio: 17 (ref 9–20)
BUN: 13 mg/dL (ref 6–20)
Bilirubin Total: 0.6 mg/dL (ref 0.0–1.2)
CO2: 22 mmol/L (ref 20–29)
Calcium: 9.8 mg/dL (ref 8.7–10.2)
Chloride: 101 mmol/L (ref 96–106)
Creatinine, Ser: 0.76 mg/dL (ref 0.76–1.27)
Globulin, Total: 2.5 g/dL (ref 1.5–4.5)
Glucose: 95 mg/dL (ref 70–99)
Potassium: 4.6 mmol/L (ref 3.5–5.2)
Sodium: 138 mmol/L (ref 134–144)
Total Protein: 7.1 g/dL (ref 6.0–8.5)
eGFR: 128 mL/min/1.73 (ref >59)

## 2024-05-07 LAB — MAGNESIUM: Magnesium: 2.1 mg/dL (ref 1.6–2.3)

## 2024-05-15 ENCOUNTER — Ambulatory Visit: Admitting: Student in an Organized Health Care Education/Training Program

## 2024-05-15 ENCOUNTER — Encounter: Payer: Self-pay | Admitting: Student in an Organized Health Care Education/Training Program

## 2024-05-15 VITALS — BP 173/114 | HR 79 | Ht 71.0 in | Wt 179.0 lb

## 2024-05-15 DIAGNOSIS — Z13220 Encounter for screening for lipoid disorders: Secondary | ICD-10-CM

## 2024-05-15 DIAGNOSIS — Z23 Encounter for immunization: Secondary | ICD-10-CM | POA: Diagnosis not present

## 2024-05-15 DIAGNOSIS — Z111 Encounter for screening for respiratory tuberculosis: Secondary | ICD-10-CM

## 2024-05-15 DIAGNOSIS — R0683 Snoring: Secondary | ICD-10-CM | POA: Insufficient documentation

## 2024-05-15 DIAGNOSIS — I10 Essential (primary) hypertension: Secondary | ICD-10-CM

## 2024-05-15 DIAGNOSIS — Z114 Encounter for screening for human immunodeficiency virus [HIV]: Secondary | ICD-10-CM

## 2024-05-15 DIAGNOSIS — Z0184 Encounter for antibody response examination: Secondary | ICD-10-CM | POA: Insufficient documentation

## 2024-05-15 DIAGNOSIS — Z131 Encounter for screening for diabetes mellitus: Secondary | ICD-10-CM

## 2024-05-15 DIAGNOSIS — R945 Abnormal results of liver function studies: Secondary | ICD-10-CM | POA: Diagnosis not present

## 2024-05-15 DIAGNOSIS — Z1159 Encounter for screening for other viral diseases: Secondary | ICD-10-CM

## 2024-05-15 NOTE — Patient Instructions (Signed)
  VISIT SUMMARY: During your visit, we discussed your ongoing fatigue and elevated liver enzymes. We reviewed your history of cerebral palsy and recent blood work results. We also addressed your blood pressure and general health maintenance needs.  YOUR PLAN: -ELEVATED LIVER ENZYMES: Your blood tests showed elevated levels of liver enzymes, which can indicate liver inflammation. This could be due to alcohol use, viral hepatitis, non-alcoholic fatty liver disease, or iron overload. We will recheck your liver enzymes and order tests for viral hepatitis and iron levels.  -FATIGUE: You have been experiencing significant fatigue for the past 7-8 months. This could be related to your cerebral palsy or other underlying conditions. We will continue to monitor this and explore potential causes.  -CEREBRAL PALSY: Cerebral palsy is a condition that affects muscle movement and coordination. It may contribute to your fatigue, but you remain independent and functional without assistive devices.  -HYPERTENSION: Your blood pressure readings are elevated, which can increase the risk of heart disease. We discussed the importance of managing your blood pressure and will monitor it closely. If it remains high, we may consider treatment options.  -GENERAL HEALTH MAINTENANCE: For your general health, you need vaccinations and screenings for [employer]. We will administer your annual flu shot and order a Quantiferon test for tuberculosis screening.  INSTRUCTIONS: Please follow up for rechecking your liver enzymes and the additional tests for viral hepatitis and iron levels. Continue to monitor your blood pressure regularly. Ensure you receive your flu shot and complete the Quantiferon test for tuberculosis screening.

## 2024-05-15 NOTE — Assessment & Plan Note (Signed)
 New problem, ALT higher than AST.  Consistent with hepatocellular inflammation.  Most commonly we see this pattern in metabolic associated liver disease.  Patient does have hypertension, but not overweight.  Will check lipids and A1c today.  Will check viral hepatitis serologies.  Will check iron to rule out hemochromatosis.  No signs on exam of portal hypertension or hyperestrogenism.

## 2024-05-15 NOTE — Assessment & Plan Note (Signed)
 New problem.  Has not been measured in quite some time.  It was elevated in the urgent care last week and again in our office.  Very young individual.  Exam not consistent with metabolic syndrome.  Will check aldosterone and renin ratio.  We talked about the possibility of treating with antihypertensives in the near future.  I asked him to do some home blood pressure monitoring follow-up with me in 4 weeks for review.  Will check renal function and urine microalbumin today.

## 2024-05-15 NOTE — Assessment & Plan Note (Signed)
 Patient with a moderate amount of fatigue.  But still very functional, working a full-time job and exercises regularly.  No anemia last week on blood work.  I am not sure if the mildly elevated liver enzymes are related to his fatigue.  He does report having snoring at home.  He is not overweight.  He does have very large adenoids.  I am wondering if you might have a mild sleep apnea.  Will do the rest of the workup for the liver issue, if that comes back reassuring, we will talk to him about possibility of a sleep study in the future.

## 2024-05-15 NOTE — Progress Notes (Signed)
 New Patient Office Visit  Subjective    Patient ID: Jason Watts, male    DOB: 12-Feb-2001  Age: 23 y.o. MRN: [id number]  CC:  Chief Complaint  Patient presents with   Establish Care    Fatigue  broke hip in 2020 and is having a lot hip pain  Blood work completed last week ALT level was high     HPI  Discussed the use of AI scribe software for clinical note transcription with the patient, who gave verbal consent to proceed.  History of Present Illness Jason Watts is a 23 year old male with cerebral palsy who presents with fatigue and elevated liver enzymes.  He has experienced significant fatigue over the past seven to eight months, despite getting adequate sleep of eight to nine hours per night. He feels 'really groggy' upon waking, and although the fatigue has not significantly impacted his daily life, he is concerned it might if it continues. He works full-time as a Warden/ranger and walks four to five miles a day around [employer], which he finds tiring. He also walks on trails or around his neighborhood on his days off.  He has a history of cerebral palsy. He does not use any assistive devices for walking and underwent extensive physical and occupational therapy as a child, which helped him become independent. He also had a hip fracture five years ago from a fall at work.  Blood work at [facility] urgent care due to his fatigue revealed elevated ALT and AST levels. He consumes alcohol three to four times a week, typically three to four twisted teas per occasion, and occasionally more when socializing with friends. He denies any other substance use, including tobacco, THC, or CBD.  No issues with mood, denying depression or anxiety. Sleep is uninterrupted, although his girlfriend reports that he snores. No episodes of apnea during sleep. No other symptoms such as fever, chills, weight loss, or appetite changes. His weight is stable, though he noted a slight increase  recently.    Outpatient Encounter Medications as of 05/15/2024  Medication Sig   [DISCONTINUED] acetaminophen  (TYLENOL ) 500 MG tablet Take 1 tablet (500 mg total) by mouth every 12 (twelve) hours. (Patient not taking: Reported on 05/15/2024)   [DISCONTINUED] amoxicillin -clavulanate (AUGMENTIN ) 875-125 MG tablet Take 1 tablet by mouth 2 (two) times daily. (Patient not taking: Reported on 05/15/2024)   [DISCONTINUED] aspirin  325 MG tablet Take 1 tablet (325 mg total) by mouth daily. (Patient not taking: Reported on 05/15/2024)   [DISCONTINUED] gabapentin  (NEURONTIN ) 100 MG capsule Take 1 capsule (100 mg total) by mouth 3 (three) times daily for 7 days. (Patient not taking: Reported on 05/15/2024)   [DISCONTINUED] ibuprofen  (ADVIL ) 800 MG tablet Take 1 tablet (800 mg total) by mouth 3 (three) times daily. (Patient not taking: Reported on 05/15/2024)   [DISCONTINUED] methocarbamol  (ROBAXIN ) 500 MG tablet Take 1 tablet (500 mg total) by mouth every 6 (six) hours as needed for muscle spasms. (Patient not taking: Reported on 05/15/2024)   [DISCONTINUED] oxyCODONE  (OXY IR/ROXICODONE ) 5 MG immediate release tablet Take 1 tablet (5 mg total) by mouth every 4 (four) hours as needed for moderate pain or severe pain. (Patient not taking: Reported on 05/15/2024)   No facility-administered encounter medications on file as of 05/15/2024.    Past Medical History:  Diagnosis Date   Cerebral palsy (HCC)    left side    Past Surgical History:  Procedure Laterality Date   ABDOMINAL SURGERY  HERNIA REPAIR     INTRAMEDULLARY (IM) NAIL INTERTROCHANTERIC Left 10/06/2018   Procedure: INTRAMEDULLARY (IM) NAIL INTERTROCHANTRIC;  Surgeon: Kendal Franky SQUIBB, MD;  Location: [facility] OR;  Service: Orthopedics;  Laterality: Left;    Family History  Problem Relation Age of Onset   Hypertension Mother        Objective    BP (!) 173/114   Pulse 79   Ht 5' 11 (1.803 m)   Wt 179 lb (81.2 kg)   BMI 24.97 kg/m    Physical Exam  Gen: Well appearing young man Ears: Normal tympanic membranes, normal hearing Eyes: Normal Mouth: Enlarged adenoids Neck: Normal circumference, normal thyroid, no nodules or adenopathy Heart: Regular, no murmur Lungs: Unlabored, clear throughout Abd: Soft, nontender, no hernias, no organomegaly Ext: Warm, no edema MSK: Mild muscular atrophy in both lower extremities, normal joints Neuro: Alert, conversational, full strength upper and lower extremities, mildly delayed get up and go, moderate stiffness in both lower extremities Psych: Appropriate mood and affect, not anxious or depressed.     Assessment & Plan:    Problem List Items Addressed This Visit       High   Hypertension (Chronic)   New problem.  Has not been measured in quite some time.  It was elevated in the urgent care last week and again in our office.  Very young individual.  Exam not consistent with metabolic syndrome.  Will check aldosterone and renin ratio.  We talked about the possibility of treating with antihypertensives in the near future.  I asked him to do some home blood pressure monitoring follow-up with me in 4 weeks for review.  Will check renal function and urine microalbumin today.      Relevant Orders   TSH   Microalbumin / creatinine urine ratio   Aldosterone + renin activity w/ ratio   Abnormal liver function - Primary (Chronic)   New problem, ALT higher than AST.  Consistent with hepatocellular inflammation.  Most commonly we see this pattern in metabolic associated liver disease.  Patient does have hypertension, but not overweight.  Will check lipids and A1c today.  Will check viral hepatitis serologies.  Will check iron to rule out hemochromatosis.  No signs on exam of portal hypertension or hyperestrogenism.      Relevant Orders   Comprehensive metabolic panel with GFR   Iron, TIBC and Ferritin Panel   Hepatitis B Surface AntiGEN   Hepatitis B Core Antibody, total    Hepatitis B surface antibody,qualitative     Medium    Snoring   Patient with a moderate amount of fatigue.  But still very functional, working a full-time job and exercises regularly.  No anemia last week on blood work.  I am not sure if the mildly elevated liver enzymes are related to his fatigue.  He does report having snoring at home.  He is not overweight.  He does have very large adenoids.  I am wondering if you might have a mild sleep apnea.  Will do the rest of the workup for the liver issue, if that comes back reassuring, we will talk to him about possibility of a sleep study in the future.      Other Visit Diagnoses       Needs flu shot       Relevant Orders   Flu vaccine trivalent PF, 6mos and older(Flulaval,Afluria,Fluarix,Fluzone) (Completed)     Screening-pulmonary TB       Relevant Orders   QuantiFERON-TB Gold  Plus     Screening for lipid disorders       Relevant Orders   Lipid panel     Screening for diabetes mellitus       Relevant Orders   Hemoglobin A1c     Screening for HIV (human immunodeficiency virus)       Relevant Orders   HIV Antibody (routine testing w rflx)     Encounter for HCV screening test for low risk patient       Relevant Orders   Hepatitis C antibody       Return in about 6 weeks (around 06/26/2024).   Cleatus Debby Specking, MD

## 2024-05-18 ENCOUNTER — Ambulatory Visit: Payer: Self-pay | Admitting: Student in an Organized Health Care Education/Training Program

## 2024-05-18 ENCOUNTER — Encounter: Payer: Self-pay | Admitting: Student in an Organized Health Care Education/Training Program

## 2024-05-18 DIAGNOSIS — E785 Hyperlipidemia, unspecified: Secondary | ICD-10-CM | POA: Insufficient documentation

## 2024-05-18 DIAGNOSIS — E7800 Pure hypercholesterolemia, unspecified: Secondary | ICD-10-CM

## 2024-05-18 MED ORDER — ROSUVASTATIN CALCIUM 20 MG PO TABS: 20.0000 mg | ORAL_TABLET | Freq: Every day | ORAL | 3 refills | Status: DC

## 2024-05-18 NOTE — Telephone Encounter (Signed)
 Patient is responding to your mychart message from this morning. Patient is questioning TB blood test results? I do not see these to be in yet

## 2024-05-21 ENCOUNTER — Ambulatory Visit: Admitting: Student in an Organized Health Care Education/Training Program

## 2024-06-05 LAB — QUANTIFERON-TB GOLD PLUS
Mitogen-NIL: 9.92 [IU]/mL
NIL: 0.02 [IU]/mL
QuantiFERON-TB Gold Plus: NEGATIVE
TB1-NIL: 0.01 [IU]/mL
TB2-NIL: 0.01 [IU]/mL

## 2024-06-05 LAB — HEPATITIS B SURFACE ANTIBODY,QUALITATIVE: Hep B S Ab: NONREACTIVE

## 2024-06-05 LAB — COMPREHENSIVE METABOLIC PANEL WITH GFR
AG Ratio: 1.9 (calc) (ref 1.0–2.5)
ALT: 71 U/L — ABNORMAL HIGH (ref 9–46)
AST: 33 U/L (ref 10–40)
Albumin: 5 g/dL (ref 3.6–5.1)
Alkaline phosphatase (APISO): 70 U/L (ref 36–130)
BUN: 14 mg/dL (ref 7–25)
CO2: 25 mmol/L (ref 20–32)
Calcium: 9.8 mg/dL (ref 8.6–10.3)
Chloride: 102 mmol/L (ref 98–110)
Creat: 0.71 mg/dL (ref 0.60–1.24)
Globulin: 2.6 g/dL (ref 1.9–3.7)
Glucose, Bld: 89 mg/dL (ref 65–99)
Potassium: 4.3 mmol/L (ref 3.5–5.3)
Sodium: 137 mmol/L (ref 135–146)
Total Bilirubin: 0.6 mg/dL (ref 0.2–1.2)
Total Protein: 7.6 g/dL (ref 6.1–8.1)
eGFR: 131 mL/min/1.73m2 (ref >=60)

## 2024-06-05 LAB — LIPID PANEL
Cholesterol: 318 mg/dL — ABNORMAL HIGH (ref <200)
HDL: 59 mg/dL (ref >=40)
LDL Cholesterol (Calc): 231 mg/dL — ABNORMAL HIGH
Non-HDL Cholesterol (Calc): 259 mg/dL — ABNORMAL HIGH (ref <130)
Total CHOL/HDL Ratio: 5.4 (calc) — ABNORMAL HIGH (ref <5.0)
Triglycerides: 132 mg/dL (ref <150)

## 2024-06-05 LAB — HEPATITIS B SURFACE ANTIGEN: Hepatitis B Surface Ag: NONREACTIVE

## 2024-06-05 LAB — ALDOSTERONE + RENIN ACTIVITY W/ RATIO
ALDO / PRA Ratio: 3.8 ratio (ref 0.9–28.9)
Aldosterone: 11 ng/dL
Renin Activity: 2.91 ng/mL/h (ref 0.25–5.82)

## 2024-06-05 LAB — HEPATITIS B CORE ANTIBODY, TOTAL: Hep B Core Total Ab: NONREACTIVE

## 2024-06-05 LAB — IRON,TIBC AND FERRITIN PANEL
%SAT: 33 % (ref 20–48)
Ferritin: 164 ng/mL (ref 38–380)
Iron: 135 ug/dL (ref 50–195)
TIBC: 411 ug/dL (ref 250–425)

## 2024-06-05 LAB — HIV ANTIBODY (ROUTINE TESTING W REFLEX)
HIV 1&2 Ab, 4th Generation: NONREACTIVE
HIV FINAL INTERPRETATION: NEGATIVE

## 2024-06-05 LAB — HEMOGLOBIN A1C
Hgb A1c MFr Bld: 5.4 % (ref <5.7)
Mean Plasma Glucose: 108 mg/dL
eAG (mmol/L): 6 mmol/L

## 2024-06-05 LAB — MICROALBUMIN / CREATININE URINE RATIO

## 2024-06-05 LAB — HEPATITIS C ANTIBODY: Hepatitis C Ab: NONREACTIVE

## 2024-06-05 LAB — TSH: TSH: 2.08 m[IU]/L (ref 0.40–4.50)

## 2024-06-29 ENCOUNTER — Ambulatory Visit: Payer: Self-pay | Admitting: Student in an Organized Health Care Education/Training Program

## 2024-06-30 ENCOUNTER — Encounter: Payer: Self-pay | Admitting: Student in an Organized Health Care Education/Training Program

## 2024-08-17 ENCOUNTER — Encounter: Payer: Self-pay | Admitting: Student in an Organized Health Care Education/Training Program

## 2024-08-17 ENCOUNTER — Ambulatory Visit: Payer: Self-pay | Admitting: Student in an Organized Health Care Education/Training Program

## 2024-08-17 VITALS — BP 157/100 | HR 89 | Temp 90.0°F | Ht 72.0 in | Wt 177.2 lb

## 2024-08-17 DIAGNOSIS — G809 Cerebral palsy, unspecified: Secondary | ICD-10-CM

## 2024-08-17 DIAGNOSIS — R945 Abnormal results of liver function studies: Secondary | ICD-10-CM

## 2024-08-17 DIAGNOSIS — I10 Essential (primary) hypertension: Secondary | ICD-10-CM

## 2024-08-17 DIAGNOSIS — E7800 Pure hypercholesterolemia, unspecified: Secondary | ICD-10-CM

## 2024-08-17 NOTE — Assessment & Plan Note (Signed)
 Most likely need metabolic associated liver disease in the setting of very high hyperlipidemia.  Iron studies were reassuring.  He is made a lot of lifestyle modifications, cut out alcohol completely.  Will recheck liver function testing.

## 2024-08-17 NOTE — Assessment & Plan Note (Signed)
 Chronic and stable.  Excellent functional status.  He is going to start a job with [employer] next week.  His cerebral palsy is stable and does not functionally limit him from this type of work in my opinion.

## 2024-08-17 NOTE — Patient Instructions (Signed)
" °  VISIT SUMMARY: During your follow-up visit, we discussed your hyperlipidemia and hypertension. You have been doing well with your cholesterol medication and making positive lifestyle changes. We also reviewed your blood pressure readings and discussed your family history and future plans.  YOUR PLAN: -PURE HYPERCHOLESTEROLEMIA: Pure hypercholesterolemia means you have high levels of cholesterol in your blood. Your total cholesterol was 318 mg/dL and LDL was 768 mg/dL. You are taking Crestor  20 mg daily, and it is important to continue this medication. We ordered a lipid panel to check your current cholesterol levels. Keep up with your lifestyle changes, including a healthy diet and regular exercise. Discuss your family history with your siblings and consider cholesterol screening for them as well.  -HYPERTENSION: Hypertension means you have high blood pressure. Your current reading is 157/100 mmHg. We are not starting you on medication due to your age and the potential for lifestyle changes to help. Monitor your blood pressure at home and follow up in six months. Continue with lifestyle modifications, including a healthy diet and regular exercise.  INSTRUCTIONS: Please continue taking Crestor  20 mg daily and monitor your blood pressure at home. We will follow up in six months to check your progress. Discuss your family history with your siblings and consider cholesterol screening for them. Keep up with your healthy lifestyle changes, including diet and exercise.    Contains text generated by [employer].   "

## 2024-08-17 NOTE — Assessment & Plan Note (Signed)
 Chronic and stable.  Blood pressure improved today compared to 3 months ago.  He is making lifestyle modifications.  Normal renal function, normal serum aldosterone.  Will check urine microalbumin today.  I suspect will need an antihypertensive at some point in the future.  I want a give him 6 more months of lifestyle modifications, then follow-up.

## 2024-08-17 NOTE — Assessment & Plan Note (Signed)
 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  He is doing well tolerating rosuvastatin  20 mg daily.  Will recheck lipids today.  Goal to get the LDL around 100.  Will see if we need to increase up to 40 mg daily.  This is primary prevention.  I recommended he has first-degree relatives checked for hyperlipidemia as well.

## 2024-08-17 NOTE — Progress Notes (Signed)
 "  Established Patient Office Visit  Patient ID: Jason Watts, male    DOB: 2001-01-21  Age: 24 y.o. MRN: [id number] PCP: Jerrell Cleatus Ned, MD  Chief Complaint  Patient presents with   Medical Management of Chronic Issues    Has paper work that needs to be filled out.  No other concerns to discuss.     Subjective:     HPI  Discussed the use of AI scribe software for clinical note transcription with the patient, who gave verbal consent to proceed.  History of Present Illness Jason Watts is a 24 year old male with hyperlipidemia who presents for a follow-up visit.  He has hyperlipidemia with a previous total cholesterol level of 318 mg/dL and an LDL of 768 mg/dL. He was started on Crestor  20 mg daily and has been compliant with the medication, using a phone timer as a reminder. He reports feeling less tired since starting the medication and has made lifestyle changes, including cutting out caffeine and alcohol.  His blood pressure has been consistently high, with a previous reading of 173/114 mmHg and a current reading of 157/100 mmHg.  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  His family history includes his mother having high blood pressure, but no known family history of hyperlipidemia. He has an 62 year old brother who is into bodybuilding and fitness, and he plans to discuss the potential genetic component of hyperlipidemia with him.  He is currently running low on his cholesterol medication and is learning to manage his prescriptions, including understanding the refill process.     Objective:     BP (!) 157/100   Pulse 89   Temp (!) 90 F (32.2 C) (Oral)   Ht 6' (1.829 m)   Wt 177 lb 3.2 oz (80.4 kg)   SpO2 96%   BMI 24.03 kg/m   Physical Exam  Gen: Well-appearing young man Neuro: Alert and  conversational, normal get up and go, mildly increased muscle tone in the lower extremities with stable gait Heart: Regular, no murmur Lungs; unlabored, clear throughout Ext: Warm, no edema    Assessment & Plan:   Problem List Items Addressed This Visit       High   Cerebral palsy (HCC) (Chronic)   Chronic and stable.  Excellent functional status.  He is going to start a job with [employer] next week.  His cerebral palsy is stable and does not functionally limit him from this type of work in my opinion.       Hypertension (Chronic)   Chronic and stable.  Blood pressure improved today compared to 3 months ago.  He is making lifestyle modifications.  Normal renal function, normal serum aldosterone.  Will check urine microalbumin today.  I suspect will need an antihypertensive at some point in the future.  I want a give him 6 more months of lifestyle modifications, then follow-up.      Relevant Orders   Microalbumin / creatinine urine ratio   Abnormal liver function (Chronic)   Most likely need metabolic associated liver disease in the setting of very high hyperlipidemia.  Iron studies were reassuring.  He is made a lot of lifestyle modifications, cut out alcohol completely.  Will recheck liver function testing.      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  He is doing well tolerating rosuvastatin  20 mg daily.  Will recheck lipids today.  Goal to get the LDL around 100.  Will see if we need to increase up to 40 mg daily.  This is primary prevention.  I recommended he has first-degree relatives checked for hyperlipidemia as well.      Relevant Orders   Lipid panel    Return in about 6 months (around 02/14/2025) for HTN management.    Cleatus Debby Specking, MD [facility]   "

## 2024-08-18 LAB — COMPREHENSIVE METABOLIC PANEL WITH GFR
ALT: 52 U/L (ref 3–53)
AST: 34 U/L (ref 5–37)
Albumin: 4.6 g/dL (ref 3.5–5.2)
Alkaline Phosphatase: 74 U/L (ref 39–117)
BUN: 12 mg/dL (ref 6–23)
CO2: 30 meq/L (ref 19–32)
Calcium: 9.7 mg/dL (ref 8.4–10.5)
Chloride: 103 meq/L (ref 96–112)
Creatinine, Ser: 0.71 mg/dL (ref 0.40–1.50)
GFR: 127.19 mL/min
Glucose, Bld: 90 mg/dL (ref 70–99)
Potassium: 4 meq/L (ref 3.5–5.1)
Sodium: 140 meq/L (ref 135–145)
Total Bilirubin: 0.6 mg/dL (ref 0.2–1.2)
Total Protein: 7.2 g/dL (ref 6.0–8.3)

## 2024-08-18 LAB — LIPID PANEL
Cholesterol: 207 mg/dL — ABNORMAL HIGH (ref 28–200)
HDL: 43.3 mg/dL
LDL Cholesterol: 112 mg/dL — ABNORMAL HIGH (ref 10–99)
NonHDL: 163.83
Total CHOL/HDL Ratio: 5
Triglycerides: 260 mg/dL — ABNORMAL HIGH (ref 10.0–149.0)
VLDL: 52 mg/dL — ABNORMAL HIGH (ref 0.0–40.0)

## 2024-08-18 LAB — MICROALBUMIN / CREATININE URINE RATIO
Creatinine,U: 207.9 mg/dL
Microalb Creat Ratio: 32.8 mg/g — ABNORMAL HIGH (ref 0.0–30.0)
Microalb, Ur: 6.8 mg/dL — ABNORMAL HIGH (ref 0.7–1.9)

## 2024-08-19 ENCOUNTER — Ambulatory Visit: Payer: Self-pay | Admitting: Student in an Organized Health Care Education/Training Program

## 2024-08-19 DIAGNOSIS — E7800 Pure hypercholesterolemia, unspecified: Secondary | ICD-10-CM

## 2024-08-19 MED ORDER — ROSUVASTATIN CALCIUM 40 MG PO TABS: 40.0000 mg | ORAL_TABLET | Freq: Every day | ORAL | 3 refills | Status: AC

## 2024-08-19 MED ORDER — ROSUVASTATIN CALCIUM 40 MG PO TABS: 40.0000 mg | ORAL_TABLET | Freq: Every day | ORAL | 3 refills | Status: DC

## 2025-02-15 ENCOUNTER — Ambulatory Visit: Payer: Self-pay | Admitting: Student in an Organized Health Care Education/Training Program
